# Patient Record
Sex: Female | Born: 1975 | Race: Black or African American | Hispanic: No | Marital: Married | Smoking: Never smoker
Health system: Southern US, Community
[De-identification: ages and names within clinical notes are randomized; demographics above are authoritative.]

## PROBLEM LIST (undated history)

## (undated) ENCOUNTER — Inpatient Hospital Stay (HOSPITAL_COMMUNITY): Payer: Self-pay

## (undated) DIAGNOSIS — R51 Headache: Secondary | ICD-10-CM

## (undated) HISTORY — PX: NO PAST SURGERIES: SHX2092

---

## 2004-02-15 ENCOUNTER — Emergency Department (HOSPITAL_COMMUNITY): Admission: EM | Admit: 2004-02-15 | Discharge: 2004-02-15 | Payer: Self-pay | Admitting: Emergency Medicine

## 2010-02-16 NOTE — L&D Delivery Note (Signed)
Delivery Note At 9:12 PM a viable female was delivered via Vaginal, Spontaneous Delivery (Presentation: Left Occiput Anterior).  APGAR: 9, 10; weight .   Placenta status: Intact, Spontaneous.  Cord: 3 vessels with the following complications: None.  Cord pH: not done  Anesthesia: Epidural  Episiotomy:  Lacerations: Sulcus;2nd degree;Perineal Suture Repair: 2.0 Est. Blood Loss (mL):   Mom to postpartum.  Baby to nursery-stable.  Lacey Wallman A 11/20/2010, 9:45 PM

## 2010-04-14 LAB — RPR: RPR: NONREACTIVE

## 2010-04-14 LAB — GC/CHLAMYDIA PROBE AMP, GENITAL
Chlamydia: NEGATIVE
Gonorrhea: NEGATIVE

## 2010-04-14 LAB — HEPATITIS B SURFACE ANTIGEN: Hepatitis B Surface Ag: NEGATIVE

## 2010-04-14 LAB — ABO/RH: RH Type: POSITIVE

## 2010-04-14 LAB — ANTIBODY SCREEN: Antibody Screen: NEGATIVE

## 2010-04-14 LAB — HIV ANTIBODY (ROUTINE TESTING W REFLEX): HIV: NONREACTIVE

## 2010-06-16 ENCOUNTER — Other Ambulatory Visit (HOSPITAL_COMMUNITY): Payer: Self-pay | Admitting: Obstetrics

## 2010-06-16 DIAGNOSIS — O2690 Pregnancy related conditions, unspecified, unspecified trimester: Secondary | ICD-10-CM

## 2010-06-20 ENCOUNTER — Other Ambulatory Visit (HOSPITAL_COMMUNITY): Payer: Self-pay | Admitting: Obstetrics

## 2010-06-20 ENCOUNTER — Ambulatory Visit (HOSPITAL_COMMUNITY)
Admission: RE | Admit: 2010-06-20 | Discharge: 2010-06-20 | Disposition: A | Discharge status: Home / Self Care | Payer: Medicaid Other | Source: Ambulatory Visit | Attending: Obstetrics | Admitting: Obstetrics

## 2010-06-20 DIAGNOSIS — Z0489 Encounter for examination and observation for other specified reasons: Secondary | ICD-10-CM

## 2010-06-20 DIAGNOSIS — O358XX0 Maternal care for other (suspected) fetal abnormality and damage, not applicable or unspecified: Secondary | ICD-10-CM | POA: Insufficient documentation

## 2010-06-20 DIAGNOSIS — O2690 Pregnancy related conditions, unspecified, unspecified trimester: Secondary | ICD-10-CM

## 2010-06-20 DIAGNOSIS — Z1389 Encounter for screening for other disorder: Secondary | ICD-10-CM | POA: Insufficient documentation

## 2010-06-20 DIAGNOSIS — Z363 Encounter for antenatal screening for malformations: Secondary | ICD-10-CM | POA: Insufficient documentation

## 2010-07-18 ENCOUNTER — Ambulatory Visit (HOSPITAL_COMMUNITY)
Admission: RE | Admit: 2010-07-18 | Discharge: 2010-07-18 | Disposition: A | Discharge status: Home / Self Care | Payer: Medicaid Other | Source: Ambulatory Visit | Attending: Obstetrics | Admitting: Obstetrics

## 2010-07-18 DIAGNOSIS — Z0489 Encounter for examination and observation for other specified reasons: Secondary | ICD-10-CM

## 2010-07-18 DIAGNOSIS — O358XX0 Maternal care for other (suspected) fetal abnormality and damage, not applicable or unspecified: Secondary | ICD-10-CM | POA: Insufficient documentation

## 2010-07-18 DIAGNOSIS — Z3689 Encounter for other specified antenatal screening: Secondary | ICD-10-CM | POA: Insufficient documentation

## 2010-08-19 ENCOUNTER — Inpatient Hospital Stay (HOSPITAL_COMMUNITY)
Admission: AD | Admit: 2010-08-19 | Discharge: 2010-08-19 | Disposition: A | Discharge status: Home / Self Care | Payer: Medicaid Other | Source: Ambulatory Visit | Attending: Obstetrics | Admitting: Obstetrics

## 2010-08-19 DIAGNOSIS — O99891 Other specified diseases and conditions complicating pregnancy: Secondary | ICD-10-CM | POA: Insufficient documentation

## 2010-08-19 DIAGNOSIS — O9989 Other specified diseases and conditions complicating pregnancy, childbirth and the puerperium: Secondary | ICD-10-CM

## 2010-10-16 LAB — STREP B DNA PROBE: GBS: NEGATIVE

## 2010-11-14 ENCOUNTER — Telehealth (HOSPITAL_COMMUNITY): Payer: Self-pay | Admitting: *Deleted

## 2010-11-14 ENCOUNTER — Encounter (HOSPITAL_COMMUNITY): Payer: Self-pay | Admitting: *Deleted

## 2010-11-14 NOTE — Telephone Encounter (Signed)
Preadmission screen  

## 2010-11-19 ENCOUNTER — Inpatient Hospital Stay (HOSPITAL_COMMUNITY): Payer: Medicaid Other | Admitting: Anesthesiology

## 2010-11-19 ENCOUNTER — Encounter (HOSPITAL_COMMUNITY): Payer: Self-pay

## 2010-11-19 ENCOUNTER — Encounter (HOSPITAL_COMMUNITY): Payer: Self-pay | Admitting: Anesthesiology

## 2010-11-19 ENCOUNTER — Inpatient Hospital Stay (HOSPITAL_COMMUNITY)
Admission: RE | Admit: 2010-11-19 | Discharge: 2010-11-22 | DRG: 775 | Disposition: A | Discharge status: Home / Self Care | Payer: Medicaid Other | Source: Ambulatory Visit | Attending: Obstetrics | Admitting: Obstetrics

## 2010-11-19 LAB — RPR: RPR Ser Ql: NONREACTIVE

## 2010-11-19 LAB — CBC
MCH: 31.7 pg (ref 26.0–34.0)
Platelets: 156 10*3/uL (ref 150–400)
RBC: 4.01 MIL/uL (ref 3.87–5.11)
WBC: 9 10*3/uL (ref 4.0–10.5)

## 2010-11-19 MED ORDER — FLEET ENEMA 7-19 GM/118ML RE ENEM: 1.0000 | ENEMA | RECTAL | Status: DC | PRN

## 2010-11-19 MED ORDER — PHENYLEPHRINE 40 MCG/ML (10ML) SYRINGE FOR IV PUSH (FOR BLOOD PRESSURE SUPPORT)
80.0000 ug | PREFILLED_SYRINGE | INTRAVENOUS | Status: DC | PRN
  Filled 2010-11-19 (×2): qty 5

## 2010-11-19 MED ORDER — ACETAMINOPHEN 325 MG PO TABS
650.0000 mg | ORAL_TABLET | ORAL | Status: DC | PRN
  Administered 2010-11-20 (×2): 650 mg via ORAL
  Filled 2010-11-19 (×2): qty 2

## 2010-11-19 MED ORDER — PROMETHAZINE HCL 25 MG/ML IJ SOLN
12.5000 mg | Freq: Four times a day (QID) | INTRAMUSCULAR | Status: DC | PRN
  Administered 2010-11-19: 12.5 mg via INTRAVENOUS
  Filled 2010-11-19 (×2): qty 1

## 2010-11-19 MED ORDER — LACTATED RINGERS IV SOLN: 500.0000 mL | Freq: Once | INTRAVENOUS | Status: DC

## 2010-11-19 MED ORDER — OXYTOCIN 20 UNITS IN LACTATED RINGERS INFUSION - SIMPLE
INTRAVENOUS | Status: AC
  Filled 2010-11-19: qty 1000

## 2010-11-19 MED ORDER — LIDOCAINE HCL (PF) 1 % IJ SOLN
30.0000 mL | INTRAMUSCULAR | Status: DC | PRN
  Administered 2010-11-20: 30 mL via SUBCUTANEOUS
  Filled 2010-11-19: qty 30

## 2010-11-19 MED ORDER — IBUPROFEN 600 MG PO TABS
600.0000 mg | ORAL_TABLET | Freq: Four times a day (QID) | ORAL | Status: DC | PRN
  Administered 2010-11-20: 600 mg via ORAL
  Filled 2010-11-19: qty 1

## 2010-11-19 MED ORDER — FENTANYL 2.5 MCG/ML BUPIVACAINE 1/10 % EPIDURAL INFUSION (WH - ANES)
14.0000 mL/h | INTRAMUSCULAR | Status: DC
  Administered 2010-11-19 – 2010-11-20 (×5): 14 mL/h via EPIDURAL
  Filled 2010-11-19 (×8): qty 60

## 2010-11-19 MED ORDER — OXYTOCIN 20 UNITS IN LACTATED RINGERS INFUSION - SIMPLE
1.0000 m[IU]/min | INTRAVENOUS | Status: DC
  Administered 2010-11-19: 2 m[IU]/min via INTRAVENOUS

## 2010-11-19 MED ORDER — OXYCODONE-ACETAMINOPHEN 5-325 MG PO TABS
2.0000 | ORAL_TABLET | ORAL | Status: DC | PRN
  Administered 2010-11-20 – 2010-11-21 (×2): 2 via ORAL
  Administered 2010-11-21 – 2010-11-22 (×2): 1 via ORAL
  Filled 2010-11-19 (×2): qty 2

## 2010-11-19 MED ORDER — TERBUTALINE SULFATE 1 MG/ML IJ SOLN: 0.2500 mg | Freq: Once | INTRAMUSCULAR | Status: AC | PRN

## 2010-11-19 MED ORDER — LACTATED RINGERS IV SOLN
INTRAVENOUS | Status: DC
  Administered 2010-11-19 – 2010-11-20 (×6): via INTRAVENOUS
  Administered 2010-11-20 (×3): 500 mL via INTRAVENOUS

## 2010-11-19 MED ORDER — DIPHENHYDRAMINE HCL 50 MG/ML IJ SOLN: 12.5000 mg | INTRAMUSCULAR | Status: DC | PRN

## 2010-11-19 MED ORDER — PHENYLEPHRINE 40 MCG/ML (10ML) SYRINGE FOR IV PUSH (FOR BLOOD PRESSURE SUPPORT)
80.0000 ug | PREFILLED_SYRINGE | INTRAVENOUS | Status: DC | PRN
  Filled 2010-11-19: qty 5

## 2010-11-19 MED ORDER — BUTORPHANOL TARTRATE 2 MG/ML IJ SOLN
1.0000 mg | INTRAMUSCULAR | Status: DC | PRN
  Administered 2010-11-19 (×2): 1 mg via INTRAVENOUS
  Filled 2010-11-19 (×2): qty 1

## 2010-11-19 MED ORDER — EPHEDRINE 5 MG/ML INJ
10.0000 mg | INTRAVENOUS | Status: DC | PRN
  Filled 2010-11-19: qty 4

## 2010-11-19 MED ORDER — LACTATED RINGERS IV SOLN
500.0000 mL | INTRAVENOUS | Status: DC | PRN
  Administered 2010-11-19: 300 mL via INTRAVENOUS

## 2010-11-19 MED ORDER — ONDANSETRON HCL 4 MG/2ML IJ SOLN: 4.0000 mg | Freq: Four times a day (QID) | INTRAMUSCULAR | Status: DC | PRN

## 2010-11-19 MED ORDER — CITRIC ACID-SODIUM CITRATE 334-500 MG/5ML PO SOLN: 30.0000 mL | ORAL | Status: DC | PRN

## 2010-11-19 MED ORDER — FENTANYL 2.5 MCG/ML BUPIVACAINE 1/10 % EPIDURAL INFUSION (WH - ANES)
INTRAMUSCULAR | Status: DC | PRN
  Administered 2010-11-19: 14 mL/h via EPIDURAL
  Administered 2010-11-20

## 2010-11-19 MED ORDER — LIDOCAINE HCL 1.5 % IJ SOLN
INTRAMUSCULAR | Status: DC | PRN
  Administered 2010-11-19 (×2): 4 mL via INTRADERMAL

## 2010-11-19 MED ORDER — OXYTOCIN BOLUS FROM INFUSION
500.0000 mL | Freq: Once | INTRAVENOUS | Status: DC
  Filled 2010-11-19: qty 500
  Filled 2010-11-19: qty 1000

## 2010-11-19 MED ORDER — EPHEDRINE 5 MG/ML INJ
10.0000 mg | INTRAVENOUS | Status: DC | PRN
  Filled 2010-11-19 (×2): qty 4

## 2010-11-19 MED ORDER — OXYTOCIN 20 UNITS IN LACTATED RINGERS INFUSION - SIMPLE
125.0000 mL/h | Freq: Once | INTRAVENOUS | Status: AC
  Administered 2010-11-20: 1 m[IU]/min via INTRAVENOUS

## 2010-11-19 NOTE — Progress Notes (Signed)
Dr Tamela Oddi notified of fhr tracing and decels, also notified of sve and that pt had been 4cm for several hrs, md also made aware of pitocin rate and uc pattern, md says to turn pitocin off and to call her back if variables do not resolve

## 2010-11-19 NOTE — Plan of Care (Signed)
Problem: Consults Goal: Birthing Suites Patient Information Press F2 to bring up selections list   Pt > [redacted] weeks EGA     

## 2010-11-19 NOTE — Anesthesia Procedure Notes (Signed)
Epidural Patient location during procedure: OB Start time: 11/19/2010 5:45 PM  Staffing Anesthesiologist: Karen Kinnard A. Performed by: anesthesiologist   Preanesthetic Checklist Completed: patient identified, site marked, surgical consent, pre-op evaluation, timeout performed, IV checked, risks and benefits discussed and monitors and equipment checked  Epidural Patient position: sitting Prep: site prepped and draped and DuraPrep Patient monitoring: continuous pulse ox and blood pressure Approach: midline Injection technique: LOR air  Needle:  Needle type: Tuohy  Needle gauge: 17 G Needle length: 9 cm Needle insertion depth: 5 cm cm Catheter type: closed end flexible Catheter size: 19 Gauge Catheter at skin depth: 10 cm Test dose: negative and 1.5% lidocaine  Assessment Events: blood not aspirated, injection not painful, no injection resistance, negative IV test and no paresthesia  Additional Notes Patient is more comfortable after epidural dosed. Please see RN's note for documentation of vital signs and FHR which are stable.

## 2010-11-19 NOTE — H&P (Signed)
This is Dr. Francoise Ceo dictating the history and physical on blank blank She's a 35 year old gravida 1 EDC 11/16/2010 she is 14 weeks and 3 days her GBS is negative Blood type O positive She's in for induction of labor her cervix is 2 cm 80% vertex -2 station She is having occasional contractions Past medical history negative Past surgical history negative Family history negative Physical exam this is a well-developed female in no distress HEENT negative Lungs clear Abdomen term Pelvic as described above Extremities negative and Heart regular rhythm no murmurs no gallops

## 2010-11-19 NOTE — Anesthesia Preprocedure Evaluation (Signed)
Anesthesia Evaluation  Name, MR# and DOB Patient awake  General Assessment Comment  Reviewed: Allergy & Precautions, H&P , Patient's Chart, lab work & pertinent test results  Airway Mallampati: III TM Distance: >3 FB Neck ROM: full    Dental No notable dental hx. (+) Teeth Intact   Pulmonary  clear to auscultation  Pulmonary exam normal       Cardiovascular regular Normal    Neuro/Psych Negative Neurological ROS  Negative Psych ROS   GI/Hepatic negative GI ROS Neg liver ROS    Endo/Other  Negative Endocrine ROS  Renal/GU negative Renal ROS  Genitourinary negative   Musculoskeletal   Abdominal   Peds  Hematology negative hematology ROS (+)   Anesthesia Other Findings   Reproductive/Obstetrics (+) Pregnancy                           Anesthesia Physical Anesthesia Plan  ASA: II  Anesthesia Plan: Epidural   Post-op Pain Management:    Induction:   Airway Management Planned:   Additional Equipment:   Intra-op Plan:   Post-operative Plan:   Informed Consent: I have reviewed the patients History and Physical, chart, labs and discussed the procedure including the risks, benefits and alternatives for the proposed anesthesia with the patient or authorized representative who has indicated his/her understanding and acceptance.     Plan Discussed with: Anesthesiologist  Anesthesia Plan Comments:         Anesthesia Quick Evaluation  

## 2010-11-19 NOTE — Progress Notes (Signed)
  Patient's cervix was 1-2 cm the vertex at -2 station She is a good labor pattern and will get her epidural shortly and can't in in labor labor tonight

## 2010-11-20 ENCOUNTER — Encounter (HOSPITAL_COMMUNITY): Payer: Self-pay

## 2010-11-20 MED ORDER — OXYTOCIN 10 UNIT/ML IJ SOLN
INTRAMUSCULAR | Status: AC
  Filled 2010-11-20: qty 2

## 2010-11-20 MED ORDER — SODIUM CHLORIDE 0.9 % IV SOLN
2.0000 g | Freq: Four times a day (QID) | INTRAVENOUS | Status: DC
  Administered 2010-11-20 (×4): 2 g via INTRAVENOUS
  Filled 2010-11-20 (×5): qty 2000

## 2010-11-20 MED ORDER — GENTAMICIN SULFATE 40 MG/ML IJ SOLN
200.0000 mg | Freq: Once | INTRAVENOUS | Status: AC
  Administered 2010-11-20: 200 mg via INTRAVENOUS
  Filled 2010-11-20: qty 5

## 2010-11-20 MED ORDER — OXYTOCIN 20 UNITS IN LACTATED RINGERS INFUSION - SIMPLE
1.0000 m[IU]/min | INTRAVENOUS | Status: DC
  Administered 2010-11-20: 8 m[IU]/min via INTRAVENOUS

## 2010-11-20 MED ORDER — LACTATED RINGERS IV SOLN
INTRAVENOUS | Status: DC
  Administered 2010-11-20: 14:00:00 via INTRAUTERINE
  Administered 2010-11-20: 325 mL via INTRAUTERINE

## 2010-11-20 MED ORDER — GENTAMICIN SULFATE 40 MG/ML IJ SOLN
170.0000 mg | Freq: Three times a day (TID) | INTRAVENOUS | Status: DC
  Administered 2010-11-20 (×2): 170 mg via INTRAVENOUS
  Filled 2010-11-20 (×3): qty 4.25

## 2010-11-20 MED ORDER — OXYTOCIN 20 UNITS IN LACTATED RINGERS INFUSION - SIMPLE
1.0000 m[IU]/min | INTRAVENOUS | Status: DC
  Administered 2010-11-20: 9 m[IU]/min via INTRAVENOUS
  Administered 2010-11-20: 5 m[IU]/min via INTRAVENOUS
  Administered 2010-11-20: 7 m[IU]/min via INTRAVENOUS
  Filled 2010-11-20: qty 1000

## 2010-11-20 NOTE — Progress Notes (Signed)
Dr Tamela Oddi called to say that she has reviewed fhr tracing again and is all right with it

## 2010-11-20 NOTE — Progress Notes (Signed)
  Patient has no 5-6 cm and a thickened tubular and the rest of the cervix 9% effaced She is still at -2 station and is having some occasional bearable decelerations An IUPC was inserted and amnioinfusion was started loading dose 325 Ringer's lactate 225 cc per hour

## 2010-11-20 NOTE — Consults (Signed)
ANTIBIOTIC CONSULT NOTE - INITIAL  Pharmacy Consult for gentamicin Indication: maternal fever  No Known Allergies  Patient Measurements: Height: 5\' 6"  (167.6 cm) Weight: 200 lb (90.719 kg) IBW/kg (Calculated) : 59.3  Adjusted Body Weight: 68.8 kg  Vital Signs: Temp: 100.5 F (38.1 C) (10/04 0102) Temp src: Oral (10/04 0102) BP: 134/81 mmHg (10/04 0102) Pulse Rate: 91  (10/04 0102) Intake/Output from previous day:   Intake/Output from this shift:    Labs:  Basename 11/19/10 0805  WBC 9.0  HGB 12.7  PLT 156  LABCREA --  CREATININE --   CrCl is unknown because no creatinine reading has been taken.    Microbiology: No results found for this or any previous visit (from the past 720 hour(s)).  Medical History: Past Medical History  Diagnosis Date  . No pertinent past medical history     Medications:     Ampicillin 2gm IV q6h Assessment:    Coverage for infection of presumed pelvic origin; R/O chorioamniotitis.  Normal urine output - therefore assumed CrCl 11ml/min for age and pregnancy.  Goal of Therapy:    Desire gentamicin peak level 6-36mcg/ml & trough level <1mcg/ml  Plan:  (1) Loading dose = 200mg  gentamicin x 1, then (2) Maintenance regimen 170mg  gentamicin q8h (if continued) (3) Will measure serum creatinine Friday morning to confirm est CrCl if tx continued. (4) Will measure actual serum gentamicin peak and trough levels if tx continued >48-72 hr or as clinically indicated.   Arby Dahir, Lloyd Huger E 11/20/2010,1:23 AM

## 2010-11-20 NOTE — Progress Notes (Signed)
Dr Tamela Oddi notified of temp, gives order for ampicillin and gentamycin, also says to restart pitocin and not to go about 33mu/min

## 2010-11-20 NOTE — Progress Notes (Signed)
Dr Tamela Oddi in hospital, called and requested to come and look at fhr tracing, informed of sve and pitocin rate, md says that she has just reviewed the tracing and thought it looked "fine" but will review fhr again

## 2010-11-21 ENCOUNTER — Encounter (HOSPITAL_COMMUNITY): Payer: Self-pay

## 2010-11-21 LAB — CBC
MCH: 32.2 pg (ref 26.0–34.0)
MCHC: 34.9 g/dL (ref 30.0–36.0)
Platelets: 118 10*3/uL — ABNORMAL LOW (ref 150–400)

## 2010-11-21 MED ORDER — FERROUS SULFATE 325 (65 FE) MG PO TABS
325.0000 mg | ORAL_TABLET | Freq: Two times a day (BID) | ORAL | Status: DC
  Administered 2010-11-21 – 2010-11-22 (×3): 325 mg via ORAL
  Filled 2010-11-21 (×3): qty 1

## 2010-11-21 MED ORDER — DIPHENHYDRAMINE HCL 25 MG PO CAPS: 25.0000 mg | ORAL_CAPSULE | Freq: Four times a day (QID) | ORAL | Status: DC | PRN

## 2010-11-21 MED ORDER — ONDANSETRON HCL 4 MG/2ML IJ SOLN: 4.0000 mg | INTRAMUSCULAR | Status: DC | PRN

## 2010-11-21 MED ORDER — IBUPROFEN 600 MG PO TABS
600.0000 mg | ORAL_TABLET | Freq: Four times a day (QID) | ORAL | Status: DC
  Administered 2010-11-21 – 2010-11-22 (×6): 600 mg via ORAL
  Filled 2010-11-21 (×6): qty 1

## 2010-11-21 MED ORDER — TETANUS-DIPHTH-ACELL PERTUSSIS 5-2.5-18.5 LF-MCG/0.5 IM SUSP: 0.5000 mL | Freq: Once | INTRAMUSCULAR | Status: DC

## 2010-11-21 MED ORDER — BENZOCAINE-MENTHOL 20-0.5 % EX AERO: 1.0000 "application " | INHALATION_SPRAY | CUTANEOUS | Status: DC | PRN

## 2010-11-21 MED ORDER — SIMETHICONE 80 MG PO CHEW: 80.0000 mg | CHEWABLE_TABLET | ORAL | Status: DC | PRN

## 2010-11-21 MED ORDER — BENZOCAINE-MENTHOL 20-0.5 % EX AERO
INHALATION_SPRAY | CUTANEOUS | Status: AC
  Filled 2010-11-21: qty 56

## 2010-11-21 MED ORDER — ONDANSETRON HCL 4 MG PO TABS: 4.0000 mg | ORAL_TABLET | ORAL | Status: DC | PRN

## 2010-11-21 MED ORDER — DIBUCAINE 1 % RE OINT: 1.0000 "application " | TOPICAL_OINTMENT | RECTAL | Status: DC | PRN

## 2010-11-21 MED ORDER — LANOLIN HYDROUS EX OINT: TOPICAL_OINTMENT | CUTANEOUS | Status: DC | PRN

## 2010-11-21 MED ORDER — PRENATAL PLUS 27-1 MG PO TABS
1.0000 | ORAL_TABLET | Freq: Every day | ORAL | Status: DC
  Administered 2010-11-21 – 2010-11-22 (×2): 1 via ORAL
  Filled 2010-11-21 (×2): qty 1

## 2010-11-21 MED ORDER — ZOLPIDEM TARTRATE 5 MG PO TABS: 5.0000 mg | ORAL_TABLET | Freq: Every evening | ORAL | Status: DC | PRN

## 2010-11-21 MED ORDER — WITCH HAZEL-GLYCERIN EX PADS: 1.0000 "application " | MEDICATED_PAD | CUTANEOUS | Status: DC | PRN

## 2010-11-21 MED ORDER — OXYCODONE-ACETAMINOPHEN 5-325 MG PO TABS
1.0000 | ORAL_TABLET | ORAL | Status: DC | PRN
  Administered 2010-11-21 (×2): 1 via ORAL
  Filled 2010-11-21 (×4): qty 1

## 2010-11-21 MED ORDER — BENZOCAINE-MENTHOL 20-0.5 % EX AERO
INHALATION_SPRAY | CUTANEOUS | Status: AC
  Administered 2010-11-21: 06:00:00
  Filled 2010-11-21: qty 56

## 2010-11-21 MED ORDER — SENNOSIDES-DOCUSATE SODIUM 8.6-50 MG PO TABS
2.0000 | ORAL_TABLET | Freq: Every day | ORAL | Status: DC
  Administered 2010-11-21: 2 via ORAL

## 2010-11-21 NOTE — Anesthesia Postprocedure Evaluation (Signed)
  Anesthesia Post-op Note  Patient: Virginia [name]  Procedure(s) Performed: * Lumbar Epidural for L&D *  Patient Location: Mother/Baby  Anesthesia Type: Epidural  Level of Consciousness: awake, alert  and oriented  Airway and Oxygen Therapy: Patient Spontanous Breathing  Post-op Pain: none  Post-op Assessment: Post-op Vital signs reviewed, Patient's Cardiovascular Status Stable, Respiratory Function Stable, Patent Airway, No signs of Nausea or vomiting, Adequate PO intake, Pain level controlled, No headache, No backache, No residual numbness and No residual motor weakness  Post-op Vital Signs: Reviewed and stable  Complications: No apparent anesthesia complications

## 2010-11-21 NOTE — Progress Notes (Signed)
  Postpartum day one Vital signs normal Lochia moderate Legs negative No complaints

## 2010-11-21 NOTE — Progress Notes (Signed)
UR Chart review completed.  

## 2010-11-22 MED ORDER — OXYCODONE-ACETAMINOPHEN 5-325 MG PO TABS: 1.0000 | ORAL_TABLET | ORAL | Status: AC | PRN

## 2010-11-22 MED ORDER — BENZOCAINE-MENTHOL 20-0.5 % EX AERO
INHALATION_SPRAY | CUTANEOUS | Status: AC
  Administered 2010-11-22: 10:00:00
  Filled 2010-11-22: qty 56

## 2010-11-22 MED ORDER — IBUPROFEN 600 MG PO TABS: 600.0000 mg | ORAL_TABLET | Freq: Four times a day (QID) | ORAL | Status: AC

## 2010-11-22 NOTE — Progress Notes (Signed)
Post Partum Day #2 S/P:spontaneous vaginal  RH status/Rubella reviewed.  Feeding: breast Subjective: No HA, SOB, CP, F/C, breast symptoms: No. Normal vaginal bleeding, no clots.     Objective:  Blood pressure 121/72, pulse 69, temperature 98.2 F (36.8 C), temperature source Oral, resp. rate 18, height 5\' 6"  (1.676 m), weight 90.719 kg (200 lb), last menstrual period 02/09/2010, unknown if currently breastfeeding.   Physical Exam:  General: alert Lochia: appropriate Uterine Fundus: firm DVT Evaluation: No evidence of DVT seen on physical exam. Ext: No c/c/e  Basename 11/21/10 0525 11/19/10 0805  HGB 9.6* 12.7  HCT 27.5* 37.4    Assessment/Plan: 35 y.o.  PPD # 2 .  normal postpartum exam Continue current postpartum care D/C home   LOS: 3 days   JACKSON-MOORE,Dawaun Brancato A 11/22/2010, 6:21 AM

## 2010-11-22 NOTE — Discharge Summary (Signed)
  Obstetric Discharge Summary Reason for Admission:IOL Prenatal Procedures: none Intrapartum Procedures: spontaneous vaginal delivery Postpartum Procedures: none Complications-Operative and Postpartum: none  Hemoglobin  Date Value Range Status  11/21/2010 9.6* 12.0-15.0 (g/dL) Final     DELTA CHECK NOTED     REPEATED TO VERIFY     HCT  Date Value Range Status  11/21/2010 27.5* 36.0-46.0 (%) Final    Discharge Diagnoses: Term Pregnancy-delivered  Discharge Information: Date: 11/22/2010 Activity: pelvic rest Diet: routine Medications: Ibuprofen, Percocet, PNV Condition: stable Instructions: refer to routine discharge instructions Discharge to: home Follow-up Information    Follow up with MARSHALL,BERNARD A, MD. Call in 6 weeks.   Contact information:   [address] [place] [address] [phone]          Newborn Data: Live born  Information for the patient's newborn:  Kelby Aline, Boy Dzentutu [[id number]]  female ; APGAR , ; weight ;  Home with mother.  JACKSON-MOORE,Tameah Mihalko A 11/22/2010, 6:19 AM

## 2011-01-01 ENCOUNTER — Encounter (HOSPITAL_COMMUNITY): Payer: Self-pay

## 2011-01-01 ENCOUNTER — Inpatient Hospital Stay (HOSPITAL_COMMUNITY)
Admission: AD | Admit: 2011-01-01 | Discharge: 2011-01-01 | Disposition: A | Discharge status: Home / Self Care | Payer: Medicaid Other | Source: Ambulatory Visit | Attending: Obstetrics | Admitting: Obstetrics

## 2011-01-01 DIAGNOSIS — R509 Fever, unspecified: Secondary | ICD-10-CM

## 2011-01-01 DIAGNOSIS — O864 Pyrexia of unknown origin following delivery: Secondary | ICD-10-CM | POA: Insufficient documentation

## 2011-01-01 LAB — URINALYSIS, ROUTINE W REFLEX MICROSCOPIC
Hgb urine dipstick: NEGATIVE
Protein, ur: NEGATIVE mg/dL
Urobilinogen, UA: 0.2 mg/dL (ref 0.0–1.0)

## 2011-01-01 LAB — DIFFERENTIAL
Basophils Absolute: 0 10*3/uL (ref 0.0–0.1)
Eosinophils Relative: 2 % (ref 0–5)
Lymphocytes Relative: 48 % — ABNORMAL HIGH (ref 12–46)

## 2011-01-01 LAB — CBC
MCV: 95.1 fL (ref 78.0–100.0)
Platelets: 216 10*3/uL (ref 150–400)
RDW: 14.1 % (ref 11.5–15.5)
WBC: 6.4 10*3/uL (ref 4.0–10.5)

## 2011-01-01 LAB — URINE MICROSCOPIC-ADD ON

## 2011-01-01 MED ORDER — ACETAMINOPHEN 325 MG PO TABS
650.0000 mg | ORAL_TABLET | Freq: Once | ORAL | Status: AC
  Administered 2011-01-01: 650 mg via ORAL
  Filled 2011-01-01: qty 2

## 2011-01-01 NOTE — Progress Notes (Signed)
Vaginal delivery w/episiotomy 11/20/2010

## 2011-01-01 NOTE — Progress Notes (Signed)
Pt states intermittent back pain, neck pain began 3 days ago, rates 9/10. Has lower abd pain on r side only, radiates down right leg. Mouth feels sour, has n/v, no diarrhea. Feels feverish. Denies sore throat.
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
## 2011-01-01 NOTE — ED Provider Notes (Signed)
History     Chief Complaint  Patient presents with  . Back Pain  . Neck Pain   HPI  Pt is 6 weeks PP after indused vaginal delivery with epdirual.  Three days ago she was running a fever of 102 with nausea and vomiting with chills and back/neck pain. She said she was too weak to come in 3 days ago and yesterday she did not have anyone to keep the baby. Today her temp is 98.66F. She used Eucolyptus  In the bath. She took one Tylenol#3 this morning at 9 am which helped. She is breast feeding.  She has not noticed any pain with her breasts or red streaks.  She denies cough or pain with urination.  Pt denies abdominal pain, she is having minimal pink vaginal discharge/bleeding.    Past Medical History  Diagnosis Date  . No pertinent past medical history     Past Surgical History  Procedure Date  . No past surgeries     No family history on file.  History  Substance Use Topics  . Smoking status: Never Smoker   . Smokeless tobacco: Not on file  . Alcohol Use: No    Allergies: No Known Allergies  Prescriptions prior to admission  Medication Sig Dispense Refill  . castor oil liquid Take 30 mLs by mouth once a week.        . folic acid (FOLVITE) 800 MCG tablet Take 800 mcg by mouth daily.        . prenatal vitamin w/FE, FA (PRENATAL 1 + 1) 27-1 MG TABS Take 1 tablet by mouth daily.          Review of Systems  Constitutional: Positive for fever and chills.  HENT: Positive for neck pain. Negative for congestion.   Respiratory: Negative for cough.   Gastrointestinal: Positive for nausea and vomiting. Negative for abdominal pain, diarrhea and constipation.  Genitourinary: Negative for dysuria.  Neurological: Positive for headaches.   Physical Exam   Blood pressure 113/75, pulse 62, temperature 98.5 F (36.9 C), temperature source Oral, resp. rate 16, height 5' 5.5" (1.664 m), weight 181 lb 6 oz (82.271 kg), currently breastfeeding.  Physical Exam  Constitutional: She is  oriented to person, place, and time. She appears well-developed and well-nourished.  Eyes: Pupils are equal, round, and reactive to light.  Neck: Normal range of motion.  Respiratory: Effort normal and breath sounds normal. No respiratory distress. She has no wheezes.       Breasts soft without palpable nodules- small area of reddness on right upper outer breast-   GI: Soft.  Musculoskeletal: Normal range of motion.  Neurological: She is alert and oriented to person, place, and time.  Skin: Skin is warm and dry.  Psychiatric: She has a normal mood and affect.    MAU Course  Procedures Exam- no abnormal findings CBC with diff- normal Urinalysis- normal except trace of hemoglobin Discussed with Dr. Tamela Oddi- pt afebrile at this time- will treat symptomatically   Assessment and Plan  PP fever of unknown origin Take Tylenol as needed Follow up for PP exam; call sooner if fever returns or increase in pain  Dorathy Stallone 01/01/2011, 11:35 AM

## 2013-02-06 LAB — OB RESULTS CONSOLE GC/CHLAMYDIA
Chlamydia: NEGATIVE
Gonorrhea: NEGATIVE

## 2013-02-06 LAB — OB RESULTS CONSOLE HIV ANTIBODY (ROUTINE TESTING): HIV: NONREACTIVE

## 2013-02-06 LAB — OB RESULTS CONSOLE RUBELLA ANTIBODY, IGM: RUBELLA: IMMUNE

## 2013-02-06 LAB — OB RESULTS CONSOLE RPR: RPR: NONREACTIVE

## 2013-02-16 NOTE — L&D Delivery Note (Signed)
Delivery Note At 7:27 AM a viable and healthy female was delivered via Vaginal, Spontaneous Delivery (Presentation: Left Occiput Anterior).  APGAR: 9, 9; weight .   Placenta status: Intact, Spontaneous.  Cord: 3 vessels with the following complications: None.    Anesthesia: Epidural  Episiotomy: None Lacerations: 1st degree  Shallow but circled from center of introitus up side to left. Suture Repair: 3.0 monocryl Est. Blood Loss (mL): 200  Mom to postpartum.  Baby to Couplet care / Skin to Skin.  [facility] AscWILLIAMS,Shawn Carattini 09/10/2013, 7:44 AM

## 2013-02-16 NOTE — L&D Delivery Note (Signed)
Attestation of Attending Supervision of Advanced Practitioner (PA/CNM/NP): Evaluation and management procedures were performed by the Advanced Practitioner under my supervision and collaboration.  I have reviewed the Advanced Practitioner's note and chart, and I agree with the management and plan.  Dillyn Joaquin, MD, FACOG Attending Obstetrician & Gynecologist Faculty Practice, [facility]   

## 2013-05-13 ENCOUNTER — Ambulatory Visit: Payer: Self-pay | Admitting: Physician Assistant

## 2013-05-13 VITALS — BP 124/72 | HR 83 | Temp 98.1°F | Resp 16 | Ht 67.0 in | Wt 175.0 lb

## 2013-05-13 DIAGNOSIS — H9209 Otalgia, unspecified ear: Secondary | ICD-10-CM

## 2013-05-13 DIAGNOSIS — H6690 Otitis media, unspecified, unspecified ear: Secondary | ICD-10-CM

## 2013-05-13 MED ORDER — AMOXICILLIN 875 MG PO TABS: 875.0000 mg | ORAL_TABLET | Freq: Two times a day (BID) | ORAL | Status: DC

## 2013-05-13 MED ORDER — HYDROCODONE-ACETAMINOPHEN 5-325 MG PO TABS: 1.0000 | ORAL_TABLET | Freq: Four times a day (QID) | ORAL | Status: DC | PRN

## 2013-05-13 NOTE — Patient Instructions (Signed)
Otitis Media, Adult Otitis media is redness, soreness, and swelling (inflammation) of the middle ear. Otitis media may be caused by allergies or, most commonly, by infection. Often it occurs as a complication of the common cold. SIGNS AND SYMPTOMS Symptoms of otitis media may include:  Earache.  Fever.  Ringing in your ear.  Headache.  Leakage of fluid from the ear. DIAGNOSIS To diagnose otitis media, your health care provider will examine your ear with an otoscope. This is an instrument that allows your health care provider to see into your ear in order to examine your eardrum. Your health care provider also will ask you questions about your symptoms. TREATMENT  Typically, otitis media resolves on its own within 3 5 days. Your health care provider may prescribe medicine to ease your symptoms of pain. If otitis media does not resolve within 5 days or is recurrent, your health care provider may prescribe antibiotic medicines if he or she suspects that a bacterial infection is the cause. HOME CARE INSTRUCTIONS   Take your medicine as directed until it is gone, even if you feel better after the first few days.  Only take over-the-counter or prescription medicines for pain, discomfort, or fever as directed by your health care provider.  Follow up with your health care provider as directed. SEEK MEDICAL CARE IF:  You have otitis media only in one ear or bleeding from your nose or both.  You notice a lump on your neck.  You are not getting better in 3 5 days.  You feel worse instead of better. SEEK IMMEDIATE MEDICAL CARE IF:   You have pain that is not controlled with medicine.  You have swelling, redness, or pain around your ear or stiffness in your neck.  You notice that part of your face is paralyzed.  You notice that the bone behind your ear (mastoid) is tender when you touch it. MAKE SURE YOU:   Understand these instructions.  Will watch your condition.  Will get help  right away if you are not doing well or get worse. Document Released: 11/08/2003 Document Revised: 11/23/2012 Document Reviewed: 08/30/2012 Exit[employer] Patient Information 2014 [employer].  

## 2013-05-13 NOTE — Progress Notes (Signed)
   Subjective:    Patient ID: Virginia [name], female    DOB: [birth year], 38 y.o.   MRN: [id number]  HPI  38 year old female presents for evaluation of acute onset of right ear pain that started suddenly today.  States she was taking a bath and her 38 year old put water in her nose. When she tried to blow it out, she had pain in her right ear associated with fullness and pressure.  This occurred about 8 hours ago. She has continued to hold her nose and try to "blow the water out." admits this causes her a great deal of pain to do and does not seem to be helping. She has never had an ear infection before. Denies any nasal congestion, cough, sore throat, fever or chills. Has not noticed any drainage from her ear.   Patient is otherwise healthy with no other concerns today.  She is 5 months pregnant.     Review of Systems  Constitutional: Negative for fever and chills.  HENT: Positive for ear pain (right side). Negative for congestion, postnasal drip, rhinorrhea, sinus pressure and sore throat.   Gastrointestinal: Negative for nausea and vomiting.  Neurological: Positive for headaches (right side). Negative for dizziness.       Objective:   Physical Exam  Constitutional: She is oriented to person, place, and time. She appears well-developed and well-nourished.  HENT:  Head: Normocephalic and atraumatic.  Right Ear: Hearing, external ear and ear canal normal. Tympanic membrane is erythematous. A middle ear effusion is present.  Left Ear: Hearing, tympanic membrane, external ear and ear canal normal.  Mouth/Throat: Uvula is midline, oropharynx is clear and moist and mucous membranes are normal.  Eyes: Conjunctivae are normal.  Neck: Normal range of motion.  Cardiovascular: Normal rate.   Pulmonary/Chest: Effort normal.  Neurological: She is alert and oriented to person, place, and time.  Psychiatric: She has a normal mood and affect. Her behavior is normal. Judgment and thought content normal.            Assessment & Plan:  Otitis media - Plan: amoxicillin (AMOXIL) 875 MG tablet  Otalgia - Plan: HYDROcodone-acetaminophen (NORCO) 5-325 MG per tablet  Patient has an AOM with an erythematous TM and middle ear effusion. Unsure why symptoms started so suddenly today but will treat with amoxicillin 875 mg bid x 10 days. Norco 5/325 mg q6hours prn severe pain - use sparingly.  RTC precautions discussed. Recheck if symptoms worsening or if they fail to improve in 48-72 hours.

## 2013-06-23 LAB — OB RESULTS CONSOLE RPR: RPR: NONREACTIVE

## 2013-08-23 LAB — OB RESULTS CONSOLE GBS: STREP GROUP B AG: NEGATIVE

## 2013-09-09 ENCOUNTER — Encounter (HOSPITAL_COMMUNITY): Payer: Self-pay | Admitting: *Deleted

## 2013-09-09 ENCOUNTER — Inpatient Hospital Stay (HOSPITAL_COMMUNITY)
Admission: AD | Admit: 2013-09-09 | Discharge: 2013-09-12 | DRG: 775 | Disposition: A | Discharge status: Home / Self Care | Payer: Medicaid Other | Source: Ambulatory Visit | Attending: Obstetrics & Gynecology | Admitting: Obstetrics & Gynecology

## 2013-09-09 DIAGNOSIS — O09529 Supervision of elderly multigravida, unspecified trimester: Secondary | ICD-10-CM | POA: Diagnosis present

## 2013-09-09 DIAGNOSIS — O4290 Premature rupture of membranes, unspecified as to length of time between rupture and onset of labor, unspecified weeks of gestation: Secondary | ICD-10-CM | POA: Diagnosis present

## 2013-09-09 HISTORY — DX: Headache: R51

## 2013-09-09 LAB — CBC
HCT: 34.7 % — ABNORMAL LOW (ref 36.0–46.0)
Hemoglobin: 11.9 g/dL — ABNORMAL LOW (ref 12.0–15.0)
MCH: 32.4 pg (ref 26.0–34.0)
MCHC: 34.3 g/dL (ref 30.0–36.0)
MCV: 94.6 fL (ref 78.0–100.0)
Platelets: 186 K/uL (ref 150–400)
RBC: 3.67 MIL/uL — ABNORMAL LOW (ref 3.87–5.11)
RDW: 15.1 % (ref 11.5–15.5)
WBC: 10.9 K/uL — ABNORMAL HIGH (ref 4.0–10.5)

## 2013-09-09 MED ORDER — EPHEDRINE 5 MG/ML INJ
10.0000 mg | INTRAVENOUS | Status: DC | PRN
  Filled 2013-09-09: qty 2

## 2013-09-09 MED ORDER — CITRIC ACID-SODIUM CITRATE 334-500 MG/5ML PO SOLN: 30.0000 mL | ORAL | Status: DC | PRN

## 2013-09-09 MED ORDER — TERBUTALINE SULFATE 1 MG/ML IJ SOLN: 0.2500 mg | Freq: Once | INTRAMUSCULAR | Status: AC | PRN

## 2013-09-09 MED ORDER — PHENYLEPHRINE 40 MCG/ML (10ML) SYRINGE FOR IV PUSH (FOR BLOOD PRESSURE SUPPORT)
80.0000 ug | PREFILLED_SYRINGE | INTRAVENOUS | Status: DC | PRN
  Filled 2013-09-09: qty 2
  Filled 2013-09-09: qty 10

## 2013-09-09 MED ORDER — LACTATED RINGERS IV SOLN
INTRAVENOUS | Status: DC
  Administered 2013-09-09 – 2013-09-10 (×2): via INTRAVENOUS

## 2013-09-09 MED ORDER — LIDOCAINE HCL (PF) 1 % IJ SOLN
30.0000 mL | INTRAMUSCULAR | Status: DC | PRN
  Filled 2013-09-09: qty 30

## 2013-09-09 MED ORDER — ACETAMINOPHEN 325 MG PO TABS: 650.0000 mg | ORAL_TABLET | ORAL | Status: DC | PRN

## 2013-09-09 MED ORDER — OXYTOCIN 40 UNITS IN LACTATED RINGERS INFUSION - SIMPLE MED: 62.5000 mL/h | INTRAVENOUS | Status: DC

## 2013-09-09 MED ORDER — OXYTOCIN BOLUS FROM INFUSION: 500.0000 mL | INTRAVENOUS | Status: DC

## 2013-09-09 MED ORDER — DIPHENHYDRAMINE HCL 50 MG/ML IJ SOLN
12.5000 mg | INTRAMUSCULAR | Status: DC | PRN
  Administered 2013-09-10: 12.5 mg via INTRAVENOUS
  Filled 2013-09-09: qty 1

## 2013-09-09 MED ORDER — OXYTOCIN 40 UNITS IN LACTATED RINGERS INFUSION - SIMPLE MED: 1.0000 m[IU]/min | INTRAVENOUS | Status: DC

## 2013-09-09 MED ORDER — ONDANSETRON HCL 4 MG/2ML IJ SOLN: 4.0000 mg | Freq: Four times a day (QID) | INTRAMUSCULAR | Status: DC | PRN

## 2013-09-09 MED ORDER — LACTATED RINGERS IV SOLN: 500.0000 mL | INTRAVENOUS | Status: DC | PRN

## 2013-09-09 MED ORDER — LACTATED RINGERS IV SOLN
500.0000 mL | Freq: Once | INTRAVENOUS | Status: AC
  Administered 2013-09-10: 500 mL via INTRAVENOUS

## 2013-09-09 MED ORDER — PHENYLEPHRINE 40 MCG/ML (10ML) SYRINGE FOR IV PUSH (FOR BLOOD PRESSURE SUPPORT)
80.0000 ug | PREFILLED_SYRINGE | INTRAVENOUS | Status: DC | PRN
  Filled 2013-09-09: qty 2

## 2013-09-09 MED ORDER — NALBUPHINE HCL 10 MG/ML IJ SOLN
10.0000 mg | INTRAMUSCULAR | Status: DC | PRN
  Administered 2013-09-10: 10 mg via INTRAVENOUS
  Filled 2013-09-09: qty 1

## 2013-09-09 MED ORDER — OXYTOCIN 40 UNITS IN LACTATED RINGERS INFUSION - SIMPLE MED
1.0000 m[IU]/min | INTRAVENOUS | Status: DC
Start: 2013-09-09 — End: 2013-09-09
  Administered 2013-09-09: 1 m[IU]/min via INTRAVENOUS
  Filled 2013-09-09: qty 1000

## 2013-09-09 MED ORDER — FENTANYL 2.5 MCG/ML BUPIVACAINE 1/10 % EPIDURAL INFUSION (WH - ANES)
14.0000 mL/h | INTRAMUSCULAR | Status: DC | PRN
  Administered 2013-09-10: 14 mL/h via EPIDURAL
  Filled 2013-09-09: qty 125

## 2013-09-09 MED ORDER — IBUPROFEN 600 MG PO TABS: 600.0000 mg | ORAL_TABLET | Freq: Four times a day (QID) | ORAL | Status: DC | PRN

## 2013-09-09 MED ORDER — OXYCODONE-ACETAMINOPHEN 5-325 MG PO TABS: 1.0000 | ORAL_TABLET | ORAL | Status: DC | PRN

## 2013-09-09 NOTE — Progress Notes (Signed)
Pt has signed [facility] papers, and IV has been removed. Pt requesting Faculty Practice physicians and midwives that are on call this weekend and is googling their information.

## 2013-09-09 NOTE — Progress Notes (Signed)
Received call from Dr. Ozan informing me that this patient does not want to continue her intrapartum care with [facility] or their covering partners, and wishes to be transferred to our service (Faculty Practice).  I met with patient, and was accompanied by other members of my team: Virginia Smith, CNM and Kristy Acosta, MD (OB Fellow). Explained the nature of our service, and the nature of rotating team members, no one is guaranteed to do her delivery.  Explained that students and residents are also part of our team. Patient verbalized understanding of the nature of our team; feels grateful that we accepted her "at this late hour". Will obtain her prenatal records from [facility], and do her H&P and orders.   UGONNA  ANYANWU, MD, FACOG Attending Obstetrician & Gynecologist Faculty Practice, [facility] 

## 2013-09-09 NOTE — Progress Notes (Signed)
Virginia [name] is a 38 y.o. G2P1001 at 8572w1d by ultrasound admitted for rupture of membranes  Subjective: Not uncomfortable.  Objective: BP 116/62  Pulse 88  Temp(Src) 98.4 F (36.9 C) (Oral)  Resp 20  Ht 5\' 6"  (1.676 m)  Wt 91.173 kg (201 lb)  BMI 32.46 kg/m2      FHT:  FHR: 125 bpm, variability: moderate,  accelerations:  Present,  decelerations:  Absent UC:   irregular, every 8 minutes SVE:   Dilation: 2.5 Effacement (%): 50 Station: -3 Exam by:: Virginia PolesGloria Payne, RN  Labs: Lab Results  Component Value Date   WBC 10.9* 09/09/2013   HGB 11.9* 09/09/2013   HCT 34.7* 09/09/2013   MCV 94.6 09/09/2013   PLT 186 09/09/2013    Assessment / Plan: Augmentation of labor, progressing well  Labor: Progressing well on Pitocin 1x1, will increase to 2x2 Preeclampsia:  n/a Fetal Wellbeing:  Category I Pain Control:  Labor support without medications I/D:  n/a Anticipated MOD:  NSVD  Virginia [name] 09/09/2013, 9:54 PM

## 2013-09-09 NOTE — H&P (Signed)
LABOR ADMISSION HISTORY AND PHYSICAL  Virginia [name] is a 38 y.o. female G2P1001 with IUP at [redacted]w[redacted]d with clear PROM 1130 this morning.  She was a patient of [facility] but did not want to be cared for by one of the cross coverage partners and wanted to transfer her care to [facility]. Please see previous notes for more details.  The nature of the Faculty Practice on call team has been explained to the patient.  She reports +FMs,  no VB, no blurry vision, headaches or peripheral edema, and RUQ pain. She desires an epidural for labor pain control. She plans on breast feeding. She request OCPs for birth control.  Dating: By LMP --->  Estimated Date of Delivery: 09/15/13  Prenatal History/Complications: None, on review of [facility] records  Past Medical History: Past Medical History  Diagnosis Date  . No pertinent past medical history   . IONGEXBM(841.3)     Past Surgical History: Past Surgical History  Procedure Laterality Date  . No past surgeries      Obstetrical History: OB History   Grav Para Term Preterm Abortions TAB SAB Ect Mult Living   2 1 1  0 0 0 0 0 0 1     2012 TSVD - underwent episiotomy, M, 7 lbs. 2015 current  Social History: History   Social History  . Marital Status: Single    Spouse Name: N/A    Number of Children: N/A  . Years of Education: N/A   Social History Main Topics  . Smoking status: Never Smoker   . Smokeless tobacco: None  . Alcohol Use: No  . Drug Use: No  . Sexual Activity: Not Currently    Birth Control/ Protection: None   Other Topics Concern  . None   Social History Narrative  . None    Family History: History reviewed. No pertinent family history.  Allergies: No Known Allergies  Prescriptions prior to admission  Medication Sig Dispense Refill  . folic acid (FOLVITE) 800 MCG tablet Take 800 mcg by mouth daily.        . Omega-3 Fatty Acids (FISH OIL PO) Take 1 capsule by mouth daily.      . Prenatal Vit-Fe Fumarate-FA  (PRENATAL MULTIVITAMIN) TABS tablet Take 1 tablet by mouth daily at 12 noon.         Review of Systems   All systems reviewed and negative except as stated in HPI  Blood pressure 116/62, pulse 88, temperature 98.4 F (36.9 C), temperature source Oral, resp. rate 20, height 5\' 6"  (1.676 m), weight 91.173 kg (201 lb), currently breastfeeding. General appearance: alert Lungs: clear to auscultation bilaterally Heart: regular rate and rhythm Abdomen: soft, non-tender; bowel sounds normal Pelvic: 2/50/-3 ballotable, soft per Dorathy Kinsman Extremities: Homans sign is negative, no sign of DVT Presentation: cephalic Fetal monitoringBaseline: 140 bpm, moderate variability, +accels, no decels Uterine activity q29min, pt feeling rarely  Dilation: 2.5 Effacement (%): 50 Station: -2 Exam by:: [employer], cnm   Prenatal labs: ABO, Rh:  O pos Antibody:  neg Rubella:  immune RPR:  neg  HBsAg:  neg  HIV:  neg  GBS:  neg  1 hr Glucola 79 Genetic screening  Harmony - Normal female  Anatomy US Normal   Prenatal Transfer Tool  Maternal Diabetes: No Genetic Screening: Normal Maternal Ultrasounds/Referrals: Normal Fetal Ultrasounds or other Referrals:  None Maternal Substance Abuse:  No Significant Maternal Medications:  None Significant Maternal Lab Results: Lab values include: Group B Strep negative  Results for orders placed during the hospital encounter of 09/09/13 (from the past 24 hour(s))  CBC   Collection Time    09/09/13  5:10 PM      Result Value Ref Range   WBC 10.9 (*) 4.0 - 10.5 K/uL   RBC 3.67 (*) 3.87 - 5.11 MIL/uL   Hemoglobin 11.9 (*) 12.0 - 15.0 g/dL   HCT 16.134.7 (*) 09.636.0 - 04.546.0 %   MCV 94.6  78.0 - 100.0 fL   MCH 32.4  26.0 - 34.0 pg   MCHC 34.3  30.0 - 36.0 g/dL   RDW 40.915.1  81.111.5 - 91.415.5 %   Platelets 186  150 - 400 K/uL    Patient Active Problem List   Diagnosis Date Noted  . PROM (premature rupture of membranes) 09/09/2013  . Normal delivery 11/22/2010     Assessment: Virginia [name] is a 38 y.o. G2P1001 at 715w1d here for PROM 1130  #Labor: pitocin for augmentation #Pain: epidural #FWB: Cat 1 #ID:  No abx indicated, GBS neg #MOF: breast #MOC: OCPs  ACOSTA,KRISTY ROCIO 09/09/2013, 6:50 PM   Attestation of Attending Supervision of Obstetric Fellow: Evaluation and management procedures were performed by the Obstetric Fellow under my supervision and collaboration.  I have reviewed the Obstetric Fellow's note and chart, and I agree with the management and plan.  Jaynie CollinsUGONNA  Jeannett Dekoning, MD, FACOG Attending Obstetrician & Gynecologist Faculty Practice, [facility] [place]

## 2013-09-09 NOTE — Progress Notes (Signed)
At bedside due to patient refusal of care.  Patient very upset and angry that Dr. Stefano GaulStringer or Nigel BridgemanVicki Latham are not available for her delivery.  She feels "abandoned at her time of need."  Both myself and Gerrit HeckJessica Emly, CNM at bedside to explain that we would be available for her care and reminded the patient that the cross coverage system had been mentioned to her during her prenatal course.  Pt understands, but said she was told they would do their best to accomodate her.  She feels like [place] would be [place].  Patient advised that should she leave the hospital with ruptured membranes- she is at risk for fetal infection and potentially both maternal or fetal death.  Pt is aware of these risks and elects to leave against medical advice.  Myna HidalgoJennifer Achille Xiang, DO [phone]) [phone] (office)

## 2013-09-09 NOTE — Progress Notes (Signed)
In to discuss POC with patient including admission, augmentation, and care providers.  Upon discussing evening providers-J. Beckey Downingxley, CNM & Katharine LookJ. Ozan, DO, patient became upset stating that she would not see any other providers.  Patient requesting that this provider stay or to see Dr.AVS or V.Emilee HeroLatham, CNM.  Explained to patient that, as a practice, we take call and her care is assumed by the providers on call.  Patient not happy with this and given option to see providers in faculty practice, but stated that "those are more strangers."  Patient then stated that she would leave and return in am. Explained to patient that if she leaves it would be against medical advice and that she would not be able to come back under the care of [facility].  Discussed risks and benefits of leaving hospital while ruptured including fetal distress and possibility of fetal death.  Patient states that "if I have to choose between me and this baby I choose me."  Dr. Katharine LookJ. Ozan contacted regarding patient and desires to leave AMA. Dr. Charlotta Newtonzan to room to discuss r/b of AMA discharge.   Laren BoomMLY, Sabatino Williard LYNN,CNM, MSN 09/09/2013, 6:36 PM

## 2013-09-09 NOTE — Progress Notes (Signed)
[facility] and understands that Faculty Practice will be assigned as her physicians.

## 2013-09-10 ENCOUNTER — Inpatient Hospital Stay (HOSPITAL_COMMUNITY): Payer: Medicaid Other | Admitting: Anesthesiology

## 2013-09-10 ENCOUNTER — Encounter (HOSPITAL_COMMUNITY): Payer: Self-pay | Admitting: Anesthesiology

## 2013-09-10 ENCOUNTER — Encounter (HOSPITAL_COMMUNITY): Payer: Medicaid Other | Admitting: Anesthesiology

## 2013-09-10 DIAGNOSIS — O09529 Supervision of elderly multigravida, unspecified trimester: Secondary | ICD-10-CM

## 2013-09-10 DIAGNOSIS — O4290 Premature rupture of membranes, unspecified as to length of time between rupture and onset of labor, unspecified weeks of gestation: Secondary | ICD-10-CM

## 2013-09-10 LAB — ABO/RH: ABO/RH(D): O POS

## 2013-09-10 LAB — RPR

## 2013-09-10 MED ORDER — SIMETHICONE 80 MG PO CHEW: 80.0000 mg | CHEWABLE_TABLET | ORAL | Status: DC | PRN

## 2013-09-10 MED ORDER — LIDOCAINE HCL (PF) 1 % IJ SOLN
INTRAMUSCULAR | Status: DC | PRN
  Administered 2013-09-10 (×2): 4 mL

## 2013-09-10 MED ORDER — ONDANSETRON HCL 4 MG PO TABS: 4.0000 mg | ORAL_TABLET | ORAL | Status: DC | PRN

## 2013-09-10 MED ORDER — ZOLPIDEM TARTRATE 5 MG PO TABS
5.0000 mg | ORAL_TABLET | Freq: Every evening | ORAL | Status: DC | PRN
  Administered 2013-09-11: 5 mg via ORAL
  Filled 2013-09-10: qty 1

## 2013-09-10 MED ORDER — PRENATAL MULTIVITAMIN CH
1.0000 | ORAL_TABLET | Freq: Every day | ORAL | Status: DC
  Administered 2013-09-10 – 2013-09-12 (×3): 1 via ORAL
  Filled 2013-09-10 (×3): qty 1

## 2013-09-10 MED ORDER — IBUPROFEN 600 MG PO TABS
600.0000 mg | ORAL_TABLET | Freq: Four times a day (QID) | ORAL | Status: DC
  Administered 2013-09-10 – 2013-09-12 (×9): 600 mg via ORAL
  Filled 2013-09-10 (×9): qty 1

## 2013-09-10 MED ORDER — FENTANYL 2.5 MCG/ML BUPIVACAINE 1/10 % EPIDURAL INFUSION (WH - ANES)
INTRAMUSCULAR | Status: DC | PRN
  Administered 2013-09-10: 14 mL/h via EPIDURAL

## 2013-09-10 MED ORDER — SENNOSIDES-DOCUSATE SODIUM 8.6-50 MG PO TABS
2.0000 | ORAL_TABLET | ORAL | Status: DC
  Administered 2013-09-11 (×2): 2 via ORAL
  Filled 2013-09-10 (×2): qty 2

## 2013-09-10 MED ORDER — BENZOCAINE-MENTHOL 20-0.5 % EX AERO
1.0000 "application " | INHALATION_SPRAY | CUTANEOUS | Status: DC | PRN
  Administered 2013-09-10 – 2013-09-12 (×4): 1 via TOPICAL
  Filled 2013-09-10 (×4): qty 56

## 2013-09-10 MED ORDER — WITCH HAZEL-GLYCERIN EX PADS: 1.0000 "application " | MEDICATED_PAD | CUTANEOUS | Status: DC | PRN

## 2013-09-10 MED ORDER — OXYCODONE-ACETAMINOPHEN 5-325 MG PO TABS
1.0000 | ORAL_TABLET | ORAL | Status: DC | PRN
  Administered 2013-09-10: 1 via ORAL
  Administered 2013-09-10: 2 via ORAL
  Administered 2013-09-10: 1 via ORAL
  Administered 2013-09-11 – 2013-09-12 (×5): 2 via ORAL
  Filled 2013-09-10: qty 2
  Filled 2013-09-10: qty 1
  Filled 2013-09-10 (×6): qty 2

## 2013-09-10 MED ORDER — LANOLIN HYDROUS EX OINT: TOPICAL_OINTMENT | CUTANEOUS | Status: DC | PRN

## 2013-09-10 MED ORDER — TETANUS-DIPHTH-ACELL PERTUSSIS 5-2.5-18.5 LF-MCG/0.5 IM SUSP: 0.5000 mL | Freq: Once | INTRAMUSCULAR | Status: DC

## 2013-09-10 MED ORDER — ONDANSETRON HCL 4 MG/2ML IJ SOLN: 4.0000 mg | INTRAMUSCULAR | Status: DC | PRN

## 2013-09-10 MED ORDER — DIBUCAINE 1 % RE OINT: 1.0000 "application " | TOPICAL_OINTMENT | RECTAL | Status: DC | PRN

## 2013-09-10 MED ORDER — DIPHENHYDRAMINE HCL 25 MG PO CAPS: 25.0000 mg | ORAL_CAPSULE | Freq: Four times a day (QID) | ORAL | Status: DC | PRN

## 2013-09-10 NOTE — Progress Notes (Signed)
Patient ID: Virginia [name] KPOH-RETVIG, female   DOB: 12/27/1975, 38 y.o.   MRN: [id number]  Requesting epidural  Filed Vitals:   09/09/13 2308 09/09/13 2354 09/10/13 0036 09/10/13 0118  BP: 121/71 121/72 134/83 118/72  Pulse: 76 74 72 74  Temp:      TempSrc:      Resp:   18   Height:      Weight:       FHR reactive UCs every 2-3 minutes 120-130 MVU/5710min  Hypotonic UCs  >> continue to increase Pitocin

## 2013-09-10 NOTE — Progress Notes (Signed)
Patient ID: Virginia [name], female   DOB: 1975/04/19, 38 y.o.   MRN: [id number] Comfortable with epidural  Filed Vitals:   09/10/13 0430 09/10/13 0456 09/10/13 0501 09/10/13 0531  BP: 111/71 113/66 118/70 116/79  Pulse: 64 63 56 80  Temp:      TempSrc:      Resp: 18 18 18 18   Height:      Weight:       FHR reassuring with some small variable decels with UCs UCs adequate  Cervix complete / 0 station  Will labor down and then push

## 2013-09-10 NOTE — Anesthesia Preprocedure Evaluation (Signed)

## 2013-09-10 NOTE — Progress Notes (Signed)
Patient ID: Virginia [name] KPOH-RETVIG, female   DOB: 1975-12-29, 38 y.o.   MRN: [id number] STarting to get a little more uncomfortable  Just now has developed some vaginal bleeding with clots.   FHR reactive with accels UCs every 2-3 min  IUPC inserted  Dilation: 2.5 Effacement (%): 50 Cervical Position: Posterior Station: -3 Presentation: Vertex Exam by:: Rosebud PolesGloria Payne, RN  Will continue to increase Pitocin.

## 2013-09-10 NOTE — Anesthesia Procedure Notes (Signed)
Epidural Patient location during procedure: OB Start time: 09/10/2013 2:26 AM  Staffing Anesthesiologist: Shirle Provencal A.  Preanesthetic Checklist Completed: patient identified, site marked, surgical consent, pre-op evaluation, timeout performed, IV checked, risks and benefits discussed and monitors and equipment checked  Epidural Patient position: sitting Prep: site prepped and draped and DuraPrep Patient monitoring: continuous pulse ox and blood pressure Approach: midline Location: L3-L4 Injection technique: LOR air  Needle:  Needle type: Tuohy  Needle gauge: 17 G Needle length: 9 cm and 9 Needle insertion depth: 4 cm Catheter type: closed end flexible Catheter size: 19 Gauge Catheter at skin depth: 9 cm Test dose: negative and Other  Assessment Events: blood not aspirated, injection not painful, no injection resistance, negative IV test and no paresthesia  Additional Notes Patient identified. Risks and benefits discussed including failed block, incomplete  Pain control, post dural puncture headache, nerve damage, paralysis, blood pressure Changes, nausea, vomiting, reactions to medications-both toxic and allergic and post Partum back pain. All questions were answered. Patient expressed understanding and wished to proceed. Sterile technique was used throughout procedure. Epidural site was Dressed with sterile barrier dressing. No paresthesias, signs of intravascular injection Or signs of intrathecal spread were encountered.  Patient was more comfortable after the epidural was dosed. Please see RN's note for documentation of vital signs and FHR which are stable.

## 2013-09-10 NOTE — Lactation Note (Signed)
This note was copied from the chart of Virginia [name]. Lactation Consultation Note  Patient Name: Virginia Darcia [name] Today's Date: 09/10/2013  Mom is experienced BF, denies questions or concerns. Some basic teaching reviewed. Lactation brochure left for review, advised of OP services and support group. Encouraged Mom to call if she would like LC assist.   Maternal Data    Feeding Feeding Type: Breast Fed  [facility]ATCH Score/Interventions                      Lactation Tools Discussed/Used     Consult Status      Alfred LevinsGranger, Sharnese Heath Ann 09/10/2013, 7:49 PM

## 2013-09-10 NOTE — Progress Notes (Signed)
Patient C/O itching.

## 2013-09-10 NOTE — Progress Notes (Signed)
Acknowledged MD order to address statement made by mother while in the delivery room "If I had to choose between myself and the baby, then I would choose myself".  Spoke with RN caring for mother and informed that she has been very appropriate during her shift and the previous shift.  Met with mother.  She was pleasant and receptive to social work intervention.  Informed that she did not have a good experience with the last pregnancy because "they did not put me back together correctly" and as a result, she had a lot of complications.  Mother states that when she got pregnant the second time, she did a lot of research on different practices before settling on the group she selected.  Informed that she was assured that someone from the practice would deliver her baby.  She went on to explain that when she arrived at the hospital, she initially say a midwife that she did not recognize but reminded her that she had seen her before, and she felt OK.  However, that person was leaving and the Physician who replace her was not  from the practice and this was very upsetting to her.  She talked about how emotional she was and anxious because of the previous delivery experience.  Patient stated that she felt very vulnerable, alone, and outnumbered.   She admits to making that statement and stated that she felt the medical team was using her situation to threaten her and it was the most reasonable response she could come up with, and also, she just wanted the person to leave her room.  Informed that after the exchange, she went on-line and looked up the house coverage for [place] and was comfortable with the reviews.  She then asked the nurse if they would see her and she was relieved when they agreed to.  Mother notes that it was a wonderful experience and she has been please with the care she has received since.  She also shared that her first husband left her because they could not have children.  And, she was shocked  when she got pregnant the first time.  Mother states that she love her children very much and would do anything to protect them.  She reports no hx of mental illness and seemed to have good support.  There were two visitors when CSW arrived that left the room to give mother privacy, and several were waiting outside the room when this writer left.   Mother informed of CSW availability.  Provided supportive feedback throughout the visit.  

## 2013-09-11 NOTE — Transfer of Care (Signed)
Immediate Anesthesia Transfer of Care Note  Patient: Virginia [name]  Procedure(s) Performed: * No procedures listed *  Patient Location: PACU and Mother/Baby  Anesthesia Type:Epidural  Level of Consciousness: awake, alert  and oriented  Airway & Oxygen Therapy: Patient Spontanous Breathing  Post-op Assessment: Report given to PACU RN and Post -op Vital signs reviewed and stable  Post vital signs: Reviewed and stable  Complications: No apparent anesthesia complications

## 2013-09-11 NOTE — Progress Notes (Signed)
Post Partum Day 1  Subjective: no complaints, tolerating PO and breastfeeding, desires condoms for family planning.  Reports decreased bleeding and pain controlled with ibuprofen.    Objective: Blood pressure 106/59, pulse 63, temperature 98.4 F (36.9 C), temperature source Oral, resp. rate 18, height 5\' 6"  (1.676 m), weight 91.173 kg (201 lb), unknown if currently breastfeeding.  Physical Exam:  Filed Vitals:   09/11/13 0605  BP: 106/59  Pulse: 63  Temp: 98.4 F (36.9 C)  Resp: 18   General: alert, cooperative and appears stated age Lochia: appropriate Uterine Fundus: firm Incision: n/a DVT Evaluation: No evidence of DVT seen on physical exam. Negative Homan's sign.   Recent Labs  09/09/13 1710  HGB 11.9*  HCT 34.7*    Assessment/Plan: Plan for discharge tomorrow and Breastfeeding   LOS: 2 days   Marlis EdelsonKARIM, Irelyn Perfecto N 09/11/2013, 7:38 AM

## 2013-09-11 NOTE — Lactation Note (Signed)
This note was copied from the chart of Virginia Navayah [name]. Lactation Consultation Note: Mom called for assist - wants to give a little formula because baby has been feeding since 4 am. Wants to breast feed and nursed her first baby for 1 yr. Easily able to hand express Colostrum. Baby going off to sleep. Encouraged to wait to give formula. Suggested spoon feeding Colostrum but mom just puts baby back to breast. She nursed for 5 minutes then off to sleep. Lots of swallows noted. Baby placed in bassinet off to sleep. To call for assist prn  Patient Name: Virginia [name] Today's Date: 09/11/2013 Reason for consult: Follow-up assessment   Maternal Data    Feeding Feeding Type: Breast Fed Length of feed: 3 min  LATCH Score/Interventions Latch: Repeated attempts needed to sustain latch, nipple held in mouth throughout feeding, stimulation needed to elicit sucking reflex.  Audible Swallowing: Spontaneous and intermittent  Type of Nipple: Everted at rest and after stimulation  Comfort (Breast/Nipple): Soft / non-tender     Hold (Positioning): No assistance needed to correctly position infant at breast.  LATCH Score: 9  Lactation Tools Discussed/Used     Consult Status Consult Status: Follow-up Date: 09/12/13 Follow-up type: In-patient    Pamelia HoitWeeks, Carrye Goller D 09/11/2013, 11:22 AM

## 2013-09-11 NOTE — Progress Notes (Signed)
UR chart review completed.  

## 2013-09-11 NOTE — Anesthesia Postprocedure Evaluation (Signed)
  Anesthesia Post-op Note  Patient: Virginia [name]  Procedure(s) Performed: * No procedures listed *  Patient Location: PACU and Mother/Baby  Anesthesia Type:Epidural  Level of Consciousness: awake, alert  and oriented  Airway and Oxygen Therapy: Patient Spontanous Breathing  Post-op Pain: mild  Post-op Assessment: Patient's Cardiovascular Status Stable, Respiratory Function Stable, No signs of Nausea or vomiting, Adequate PO intake, Pain level controlled and No headache  Post-op Vital Signs: Reviewed and stable  Last Vitals:  Filed Vitals:   09/11/13 0605  BP: 106/59  Pulse: 63  Temp: 36.9 C  Resp: 18    Complications: No apparent anesthesia complications

## 2013-09-12 MED ORDER — IBUPROFEN 600 MG PO TABS: 600.0000 mg | ORAL_TABLET | Freq: Four times a day (QID) | ORAL | Status: DC

## 2013-09-12 NOTE — Discharge Summary (Signed)
Obstetric Discharge Summary Reason for Admission: rupture of membranes Prenatal Procedures: NST Intrapartum Procedures: spontaneous vaginal delivery Postpartum Procedures: none Complications-Operative and Postpartum: First degree perineal laceration Hemoglobin  Date Value Ref Range Status  09/09/2013 11.9* 12.0 - 15.0 [name]/dL Final     HCT  Date Value Ref Range Status  09/09/2013 34.7* 36.0 - 46.0 % Final  Hospital Course: Pt. Was initially seen in the MAU as she requested to be seen by house coverage rather than her previous physician group. She was found to be PROM status, and admitted to L&D. There she was given an epidural, and received augmentation of labor with pitocin. She progressed to NSVD appropriately without complications. She had an uncomplicated intrapartum course. She is now postpartum day two and is ambulating, pain well controlled, tolerating po, and has no complaints. She is stable and ready for discharge. Of note, pt. Was transferred to faculty practice care per patient request during this hospitalization. She  Would like to follow up with faculty practice for her future OB/GYN care.   Delivery Note  At 7:27 AM a viable and healthy female was delivered via Vaginal, Spontaneous Delivery (Presentation: Left Occiput Anterior). APGAR: 9, 9; weight .  Placenta status: Intact, Spontaneous. Cord: 3 vessels with the following complications: None.  Anesthesia: Epidural  Episiotomy: None  Lacerations: 1st degree Shallow but circled from center of introitus up side to left.  Suture Repair: 3.0 monocryl  Est. Blood Loss (mL): 200  Mom to postpartum. Baby to Couplet care / Skin to Skin.  [facility] SystemWILLIAMS,MARIE  09/10/2013, 7:44 AM   Physical Exam:  General: alert, cooperative and no distress Lochia: appropriate Uterine Fundus: firm Incision: N/A DVT Evaluation: No evidence of DVT seen on physical exam. No cords or calf tenderness.  Discharge Diagnoses: Term  Pregnancy-delivered  Discharge Information: Date: 09/12/2013 Activity: unrestricted and pelvic rest Diet: routine Medications: PNV and Ibuprofen Condition: stable Instructions: refer to practice specific booklet Discharge to: home Follow-up Information   [facility] [place] [facility] . Schedule an appointment as soon as possible for a visit in 4 weeks. (Postpartum Follow Up )       [facility]. Schedule an appointment as soon as possible for a visit in 4 weeks. (For postpartum follow up and establishment of care. )    Specialty:  Obstetrics and Gynecology   Contact information:   [address] [place] [id number] [phone]      Newborn Data: Live born female  Birth Weight: 6 lb 4.9 oz (2860 [name]) APGAR: 9, 9  Home with mother.  Virginia [name] 09/12/2013, 11:22 AM

## 2013-09-12 NOTE — Discharge Summary (Signed)
Attestation of Attending Supervision of Resident: Evaluation and management procedures were performed by the Family Medicine Resident under my supervision.  I have seen and examined the patient, reviewed the resident's note and chart, and I agree with the management and plan.  Donesha Wallander, MD, FACOG Attending Obstetrician & Gynecologist Faculty Practice, [facility]    

## 2013-09-12 NOTE — Discharge Instructions (Signed)
Before Baby Comes Home °Ask any questions about feeding, diapering, and baby care before you leave the hospital. Ask again if you do not understand. Ask when you need to see the doctor again. °There are several things you must have before your baby comes home. °· Infant car seat. °· Crib. °¨ Do not let your baby sleep in a bed with you or anyone else. °¨ If you do not have a bed for your baby, ask the doctor what you can use that will be safe for the baby to sleep in. °Infant feeding supplies: °· 6 to 8 bottles (8 ounce size). °· 6 to 8 nipples. °· Measuring cup. °· Measuring tablespoon. °· Bottle brush. °· Sterilizer (or use any large pan or kettle with a lid). °· Formula that contains iron. °· A way to boil and cool water. °Breastfeeding supplies: °· Breast pump. °· Nipple cream. °Clothing: °· 24 to 36 cloth diapers and waterproof diaper covers or a box of disposable diapers. You may need as many as 10 to 12 diapers per day. °· 3 onesies (other clothing will depend on the time of year and the weather). °· 3 receiving blankets. °· 3 baby pajamas or gowns. °· 3 bibs. °Bath equipment: °· Mild soap. °· Petroleum jelly. No baby oil or powder. °· Soft cloth towel and washcloth. °· Cotton balls. °· Separate bath basin for baby. Only sponge bathe until umbilical cord and circumcision are healed. °Other supplies: °· Thermometer and bulb syringe (ask the hospital to send them home with you). Ask your doctor about how you should take your baby's temperature. °· One to two pacifiers. °Prepare for an emergency: °· Know how to get to the hospital and know where to admit your baby. °· Put all doctor numbers near your house phone and in your cell phone if you have one. °Prepare your family: °· Talk with siblings about the baby coming home and how they feel about it. °· Decide how you want to handle visitors and other family members. °· Take offers for help with the baby. You will need time to adjust. °Know when to call the  doctor.  °GET HELP RIGHT AWAY IF: °· Your baby's temperature is greater than 100.4°F (38°C). °· The soft spot on your baby's head starts to bulge. °· Your baby is crying with no tears or has no wet diapers for 6 hours. °· Your baby has rapid breathing. °· Your baby is not as alert. °Document Released: 01/16/2008 Document Revised: 06/19/2013 Document Reviewed: 04/24/2010 °ExitCare® Patient Information ©2015 [employer]. This information is not intended to replace advice given to you by your health care provider. Make sure you discuss any questions you have with your health care provider. °Vaginal Delivery, Care After °Refer to this sheet in the next few weeks. These discharge instructions provide you with information on caring for yourself after delivery. Your caregiver may also give you specific instructions. Your treatment has been planned according to the most current medical practices available, but problems sometimes occur. Call your caregiver if you have any problems or questions after you go home. °HOME CARE INSTRUCTIONS °· Take over-the-counter or prescription medicines only as directed by your caregiver or pharmacist. °· Do not drink alcohol, especially if you are breastfeeding or taking medicine to relieve pain. °· Do not chew or smoke tobacco. °· Do not use illegal drugs. °· Continue to use good perineal care. Good perineal care includes: °¨ Wiping your perineum from front to back. °¨ Keeping your   perineum clean. °· Do not use tampons or douche until your caregiver says it is okay. °· Shower, wash your hair, and take tub baths as directed by your caregiver. °· Wear a well-fitting bra that provides breast support. °· Eat healthy foods. °· Drink enough fluids to keep your urine clear or pale yellow. °· Eat high-fiber foods such as whole grain cereals and breads, brown rice, beans, and fresh fruits and vegetables every day. These foods may help prevent or relieve constipation. °· Follow your caregiver's  recommendations regarding resumption of activities such as climbing stairs, driving, lifting, exercising, or traveling. °· Talk to your caregiver about resuming sexual activities. Resumption of sexual activities is dependent upon your risk of infection, your rate of healing, and your comfort and desire to resume sexual activity. °· Try to have someone help you with your household activities and your newborn for at least a few days after you leave the hospital. °· Rest as much as possible. Try to rest or take a nap when your newborn is sleeping. °· Increase your activities gradually. °· Keep all of your scheduled postpartum appointments. It is very important to keep your scheduled follow-up appointments. At these appointments, your caregiver will be checking to make sure that you are healing physically and emotionally. °SEEK MEDICAL CARE IF:  °· You are passing large clots from your vagina. Save any clots to show your caregiver. °· You have a foul smelling discharge from your vagina. °· You have trouble urinating. °· You are urinating frequently. °· You have pain when you urinate. °· You have a change in your bowel movements. °· You have increasing redness, pain, or swelling near your vaginal incision (episiotomy) or vaginal tear. °· You have pus draining from your episiotomy or vaginal tear. °· Your episiotomy or vaginal tear is separating. °· You have painful, hard, or reddened breasts. °· You have a severe headache. °· You have blurred vision or see spots. °· You feel sad or depressed. °· You have thoughts of hurting yourself or your newborn. °· You have questions about your care, the care of your newborn, or medicines. °· You are dizzy or light-headed. °· You have a rash. °· You have nausea or vomiting. °· You were breastfeeding and have not had a menstrual period within 12 weeks after you stopped breastfeeding. °· You are not breastfeeding and have not had a menstrual period by the 12th week after  delivery. °· You have a fever. °SEEK IMMEDIATE MEDICAL CARE IF:  °· You have persistent pain. °· You have chest pain. °· You have shortness of breath. °· You faint. °· You have leg pain. °· You have stomach pain. °· Your vaginal bleeding saturates two or more sanitary pads in 1 hour. °MAKE SURE YOU:  °· Understand these instructions. °· Will watch your condition. °· Will get help right away if you are not doing well or get worse. °Document Released: 01/31/2000 Document Revised: 06/19/2013 Document Reviewed: 09/30/2011 °ExitCare® Patient Information ©2015 [employer]. This information is not intended to replace advice given to you by your health care provider. Make sure you discuss any questions you have with your health care provider. ° °

## 2013-10-24 ENCOUNTER — Ambulatory Visit (INDEPENDENT_AMBULATORY_CARE_PROVIDER_SITE_OTHER): Payer: Medicaid Other | Admitting: Obstetrics & Gynecology

## 2013-10-24 ENCOUNTER — Encounter: Payer: Self-pay | Admitting: Obstetrics & Gynecology

## 2013-10-24 NOTE — Patient Instructions (Signed)
Return to clinic for any scheduled appointments or for any gynecologic concerns as needed.   

## 2013-10-24 NOTE — Progress Notes (Signed)
    Subjective:     Virginia [name] is a 38 y.o. G43P2002 female who presents for a postpartum visit. She is 6 weeks postpartum following a spontaneous vaginal delivery. I have fully reviewed the prenatal and intrapartum course. The delivery was at 39 gestational weeks.  Anesthesia: epidural. Postpartum course has been uncomplicated. Baby's course has been uncomplicated. Baby is feeding by breast. Currently started menstrual period. Bowel function is normal. Bladder function is normal. Patient is not sexually active. Contraception method is condoms. Postpartum depression screening: negative.  The following portions of the patient's history were reviewed and updated as appropriate: allergies, current medications, past family history, past medical history, past social history, past surgical history and problem list.  Normal pap smear in January 2015 at [facility], will obtain records.  Review of Systems Pertinent items are noted in HPI.   Objective:    BP 127/93  Pulse 64  Ht  (1.676 m)  Wt 193 lb (87.544 kg)  BMI 31.17 kg/m2  Breastfeeding? Yes  General:  alert and no distress   Breasts:  inspection negative, no nipple discharge or bleeding, no masses or nodularity palpable  Lungs: clear to auscultation bilaterally  Heart:  regular rate and rhythm  Abdomen: soft, non-tender; bowel sounds normal; no masses,  no organomegaly   Vulva:  normal  Vagina: normal vagina  Cervix:  multiparous appearance  Corpus: normal size, contour, position, consistency, mobility, non-tender  Adnexa:  normal adnexa and no mass, fullness, tenderness  Rectal Exam: Normal rectovaginal exam      Assessment:   Normal postpartum exam. Pap smear not done at today's visit.  Plan:   1. Contraception: condoms 2. Follow up as needed.    Jaynie Collins, MD, FACOG Attending Obstetrician & Gynecologist Center for [employer], [facility]

## 2013-12-18 ENCOUNTER — Encounter: Payer: Self-pay | Admitting: Obstetrics & Gynecology

## 2015-01-20 ENCOUNTER — Inpatient Hospital Stay (HOSPITAL_COMMUNITY): Payer: Medicaid Other

## 2015-01-20 ENCOUNTER — Inpatient Hospital Stay (HOSPITAL_COMMUNITY)
Admission: AD | Admit: 2015-01-20 | Discharge: 2015-01-20 | Disposition: A | Discharge status: Home / Self Care | Payer: Medicaid Other | Source: Ambulatory Visit | Attending: Obstetrics & Gynecology | Admitting: Obstetrics & Gynecology

## 2015-01-20 ENCOUNTER — Encounter (HOSPITAL_COMMUNITY): Payer: Self-pay | Admitting: *Deleted

## 2015-01-20 DIAGNOSIS — O4691 Antepartum hemorrhage, unspecified, first trimester: Secondary | ICD-10-CM

## 2015-01-20 DIAGNOSIS — O209 Hemorrhage in early pregnancy, unspecified: Secondary | ICD-10-CM

## 2015-01-20 DIAGNOSIS — O200 Threatened abortion: Secondary | ICD-10-CM | POA: Diagnosis present

## 2015-01-20 LAB — URINALYSIS, ROUTINE W REFLEX MICROSCOPIC
BILIRUBIN URINE: NEGATIVE
GLUCOSE, UA: NEGATIVE mg/dL
Ketones, ur: NEGATIVE mg/dL
Leukocytes, UA: NEGATIVE
Nitrite: NEGATIVE
PROTEIN: NEGATIVE mg/dL
Specific Gravity, Urine: <1.005 — ABNORMAL LOW (ref 1.005–1.030)
pH: 6 (ref 5.0–8.0)

## 2015-01-20 LAB — CBC
HCT: 37.5 % (ref 36.0–46.0)
Hemoglobin: 12.9 g/dL (ref 12.0–15.0)
MCH: 30.9 pg (ref 26.0–34.0)
MCHC: 34.4 g/dL (ref 30.0–36.0)
MCV: 89.9 fL (ref 78.0–100.0)
PLATELETS: 257 10*3/uL (ref 150–400)
RBC: 4.17 MIL/uL (ref 3.87–5.11)
RDW: 13.8 % (ref 11.5–15.5)
WBC: 8.7 10*3/uL (ref 4.0–10.5)

## 2015-01-20 LAB — URINE MICROSCOPIC-ADD ON

## 2015-01-20 LAB — POCT PREGNANCY, URINE: PREG TEST UR: POSITIVE — AB

## 2015-01-20 LAB — HCG, QUANTITATIVE, PREGNANCY: HCG, BETA CHAIN, QUANT, S: 33660 m[IU]/mL — AB (ref <5)

## 2015-01-20 NOTE — MAU Provider Note (Signed)
History     CSN: [id number]  Arrival date and time: 01/20/15 1045   Seen by provider at 1130    Chief Complaint  Patient presents with  . Vaginal Bleeding   HPI Virginia [name] 39 y.o. [redacted]w[redacted]d Comes to MAU with a large amount of painless bleeding at home today.  Is worried about having a miscarriage.  Wants this pregnancy.  Asking for treatment to save the pregnancy.  No prenatal care yet.  Thinks she is 8 weeks but LMP is uncertain.  Had a mild cramp 2 days ago, then small amount of spotting when wiping yesterday and today has had a large amount of painless bleeding at home today.  OB History    Gravida Para Term Preterm AB TAB SAB Ectopic Multiple Living   0 0 0 0 0 0 2      Past Medical History  Diagnosis Date  . [id number](478.2)     Past Surgical History  Procedure Laterality Date  . No past surgeries      History reviewed. No pertinent family history.  Social History  Substance Use Topics  . Smoking status: Never Smoker   . Smokeless tobacco: None  . Alcohol Use: No    Allergies: No Known Allergies  Prescriptions prior to admission  Medication Sig Dispense Refill Last Dose  . folic acid (FOLVITE) 1 MG tablet Take 1 mg by mouth daily.   01/20/2015 at Unknown time  . Omega-3 Fatty Acids (FISH OIL PO) Take 1 capsule by mouth daily.   01/20/2015 at Unknown time  . Prenatal Vit-Fe Fumarate-FA (PRENATAL MULTIVITAMIN) TABS tablet Take 1 tablet by mouth daily at 12 noon.   01/20/2015 at Unknown time  . ibuprofen (ADVIL,MOTRIN) 600 MG tablet Take 1 tablet (600 mg total) by mouth every 6 (six) hours. (Patient not taking: Reported on 01/20/2015) 30 tablet 0 Not Taking    Review of Systems  Constitutional: Negative for fever.  Gastrointestinal: Negative for nausea, vomiting and abdominal pain.  Genitourinary:       Vaginal bleeding No dysuria   Physical Exam   Blood pressure 112/87, pulse 83, temperature 98.1 F (36.7 C), temperature source Oral, resp.  rate 18, height  (1.676 m), weight 170 lb (77.111 kg), last menstrual period 11/18/2014, currently breastfeeding.  Physical Exam  Nursing note and vitals reviewed. Constitutional: She is oriented to person, place, and time. She appears well-developed and well-nourished.  Tearful and wants treatment so she does not lose the baby.  HENT:  Head: Normocephalic.  Eyes: EOM are normal.  Neck: Neck supple.  Respiratory: Effort normal.  Musculoskeletal: Normal range of motion.  Neurological: She is alert and oriented to person, place, and time.  Skin: Skin is warm and dry.  Psychiatric: She has a normal mood and affect.    MAU Course  Procedures Results for orders placed or performed during the hospital encounter of 01/20/15 (from the past 24 hour(s))  Urinalysis, Routine w reflex microscopic (not at [facility])     Status: Abnormal   Collection Time: 01/20/15 11:10 AM  Result Value Ref Range   Color, Urine YELLOW YELLOW   APPearance CLEAR CLEAR   Specific Gravity, Urine <1.005 (L) 1.005 - 1.030   pH 6.0 5.0 - 8.0   Glucose, UA NEGATIVE NEGATIVE mg/dL   Hgb urine dipstick MODERATE (A) NEGATIVE   Bilirubin Urine NEGATIVE NEGATIVE   Ketones, ur NEGATIVE NEGATIVE mg/dL   Protein, ur NEGATIVE NEGATIVE mg/dL   Nitrite  NEGATIVE NEGATIVE   Leukocytes, UA NEGATIVE NEGATIVE  Urine microscopic-add on     Status: Abnormal   Collection Time: 01/20/15 11:10 AM  Result Value Ref Range   Squamous Epithelial / LPF 0-5 (A) NONE SEEN   WBC, UA 0-5 0 - 5 WBC/hpf   RBC / HPF 0-5 0 - 5 RBC/hpf   Bacteria, UA FEW (A) NONE SEEN  Pregnancy, urine POC     Status: Abnormal   Collection Time: 01/20/15 11:58 AM  Result Value Ref Range   Preg Test, Ur POSITIVE (A) NEGATIVE  CBC     Status: None   Collection Time: 01/20/15 12:25 PM  Result Value Ref Range   WBC 8.7 4.0 - 10.5 K/uL   RBC 4.17 3.87 - 5.11 MIL/uL   Hemoglobin 12.9 12.0 - 15.0 g/dL   HCT 16.137.5 09.636.0 - 04.546.0 %   MCV 89.9 78.0 - 100.0 fL   MCH  30.9 26.0 - 34.0 pg   MCHC 34.4 30.0 - 36.0 g/dL   RDW 40.913.8 81.111.5 - 91.415.5 %   Platelets 257 150 - 400 K/uL  hCG, quantitative, pregnancy     Status: Abnormal   Collection Time: 01/20/15 12:25 PM  Result Value Ref Range   hCG, Beta Chain, Quant, S 33660 (H) <5 mIU/mL    MDM Prior to ultrasound, discussed that the evaluation for a healthy pregnancy will include lab work and ultrasound and there is no treatment at this time to stop the bleeding or to make the pregnancy be OK. Reviewed the ultrasound report.  Reviewed with Dr. Penne LashLeggett.  Dr. Penne LashLeggett in and discussed the ultrasound results and plan of care with client and her husband. Blood Type O positive  Assessment and Plan  IUGS at 6754w3d and moderate subchorionic hemorrhage Bleeding in pregnancy - threatened miscarriage  Plan Repeat quant in the clinic on Tuesday morning. If the Sharene Buttersquant is rising, will schedule US in 12 days from Tuesday. Rest at home, try not to worry, take a multivitamin, return on Tuesday, no smoking, no drugs, no alcohol. Message sent to clinic.  Virginia [name] 01/20/2015, 1:07 PM

## 2015-01-20 NOTE — Discharge Instructions (Signed)
Return on Tuesday morning for repeat labs in the clinic on the ground floor. Return sooner if you have heavy vaginal bleeding. Rest, try not to worry, take your vitamins, no intercourse, drink lots of water, and do not do heavy lifting or strenuous activity.

## 2015-01-20 NOTE — MAU Note (Signed)
Patient presents stating that she is [redacted] weeks pregnant with c/o vaginal bleeding since this morning. Denies pain.

## 2015-01-22 ENCOUNTER — Other Ambulatory Visit: Payer: Medicaid Other

## 2015-01-22 ENCOUNTER — Telehealth: Payer: Self-pay | Admitting: *Deleted

## 2015-01-22 DIAGNOSIS — O200 Threatened abortion: Secondary | ICD-10-CM

## 2015-01-22 LAB — HCG, QUANTITATIVE, PREGNANCY: hCG, Beta Chain, Quant, S: 13184 m[IU]/mL — ABNORMAL HIGH (ref <5)

## 2015-01-22 NOTE — Progress Notes (Unsigned)
Patient here for stat bhcg. She reports heavy bleeding and cramping last night.States is light bleeding now.  Informed her we would draw stat bhcg and call results today .  Discussed with Dr. Shawnie PonsPratt and will proceed with stat bhcg.

## 2015-01-22 NOTE — Telephone Encounter (Signed)
Received results of her bhcg from Dr. Shawnie PonsPratt. Called Virginia [name] to notify her that her hormone levels have dropped from 36000 to 13000 which means it is likely she is miscarrying. She voices understanding. I advised her to come back to MAU if she has severe pain or heavy bleeding.

## 2015-01-23 ENCOUNTER — Telehealth: Payer: Self-pay

## 2015-01-23 NOTE — Telephone Encounter (Signed)
Per Dr. Shawnie PonsPratt, pt beta levels indicate SAB.  Called pt with results and advised that she come in one week for rpt beta draw to monitor her levels to get to less than two.  Pt agreed and stated that she will be able to come in on 01/28/15 @ 0900.  Message sent to [employer] staff for scheduling.

## 2015-01-28 ENCOUNTER — Other Ambulatory Visit: Payer: Medicaid Other

## 2015-01-28 DIAGNOSIS — O039 Complete or unspecified spontaneous abortion without complication: Secondary | ICD-10-CM

## 2015-01-29 ENCOUNTER — Other Ambulatory Visit (HOSPITAL_COMMUNITY): Payer: Self-pay | Admitting: Obstetrics

## 2015-01-29 ENCOUNTER — Telehealth: Payer: Self-pay | Admitting: *Deleted

## 2015-01-29 DIAGNOSIS — O3680X0 Pregnancy with inconclusive fetal viability, not applicable or unspecified: Secondary | ICD-10-CM

## 2015-01-29 DIAGNOSIS — O2620 Pregnancy care for patient with recurrent pregnancy loss, unspecified trimester: Secondary | ICD-10-CM

## 2015-01-29 LAB — HCG, QUANTITATIVE, PREGNANCY: HCG, BETA CHAIN, QUANT, S: 696.7 m[IU]/mL — AB

## 2015-01-29 NOTE — Telephone Encounter (Signed)
Per Dr Adrian BlackwaterStinson, hcg still elevated needs repeat in 2 weeks. Called patient and left message that we are calling with results.

## 2015-01-31 ENCOUNTER — Encounter: Payer: Self-pay | Admitting: General Practice

## 2015-01-31 ENCOUNTER — Other Ambulatory Visit (HOSPITAL_COMMUNITY): Payer: Self-pay | Admitting: Obstetrics

## 2015-01-31 ENCOUNTER — Ambulatory Visit (HOSPITAL_COMMUNITY)
Admission: RE | Admit: 2015-01-31 | Discharge: 2015-01-31 | Disposition: A | Discharge status: Home / Self Care | Payer: Medicaid Other | Source: Ambulatory Visit | Attending: Obstetrics | Admitting: Obstetrics

## 2015-01-31 DIAGNOSIS — O3680X0 Pregnancy with inconclusive fetal viability, not applicable or unspecified: Secondary | ICD-10-CM

## 2015-01-31 DIAGNOSIS — Z3A00 Weeks of gestation of pregnancy not specified: Secondary | ICD-10-CM | POA: Insufficient documentation

## 2015-01-31 DIAGNOSIS — O4690 Antepartum hemorrhage, unspecified, unspecified trimester: Secondary | ICD-10-CM | POA: Diagnosis not present

## 2015-01-31 NOTE — Telephone Encounter (Signed)
Called patient at both numbers, no answer- left messages to call us back at the clinics from results. Will send letter

## 2015-02-13 ENCOUNTER — Other Ambulatory Visit: Payer: Medicaid Other

## 2015-02-13 DIAGNOSIS — Z8759 Personal history of other complications of pregnancy, childbirth and the puerperium: Secondary | ICD-10-CM

## 2015-02-14 LAB — HCG, QUANTITATIVE, PREGNANCY: hCG, Beta Chain, Quant, S: 6.9 m[IU]/mL — ABNORMAL HIGH

## 2015-02-25 ENCOUNTER — Telehealth: Payer: Self-pay | Admitting: General Practice

## 2015-02-25 NOTE — Telephone Encounter (Signed)
Patient's bhcg shows complete pregnancy, no further checks are needed per Dr Debroah LoopArnold. Called patient and informed her of results. Patient verbalized understanding & had no questions

## 2015-11-25 ENCOUNTER — Encounter (HOSPITAL_COMMUNITY): Payer: Self-pay

## 2015-12-26 ENCOUNTER — Encounter: Payer: Self-pay | Admitting: *Deleted

## 2015-12-26 ENCOUNTER — Ambulatory Visit (INDEPENDENT_AMBULATORY_CARE_PROVIDER_SITE_OTHER): Payer: Self-pay | Admitting: *Deleted

## 2015-12-26 DIAGNOSIS — Z3201 Encounter for pregnancy test, result positive: Secondary | ICD-10-CM

## 2015-12-26 LAB — POCT PREGNANCY, URINE: Preg Test, Ur: POSITIVE — AB

## 2015-12-26 NOTE — Progress Notes (Signed)
Positive pregnancy test.  Verification letter printed and given to patient.  Medications reviewed.  Continue medications.  LMP 11/18/15.  EDC 08/24/16.

## 2016-01-14 ENCOUNTER — Encounter: Payer: Self-pay | Admitting: Certified Nurse Midwife

## 2016-01-28 ENCOUNTER — Encounter: Payer: Self-pay | Admitting: Advanced Practice Midwife

## 2016-02-04 ENCOUNTER — Encounter: Payer: Self-pay | Admitting: Student

## 2016-02-17 NOTE — L&D Delivery Note (Signed)
Delivery Note  Labor complicated by Triple I treated w/ amp/gent, as well as variable decels, treated with amnioinfusion.   At 5:39 AM a viable female was delivered via Vaginal, Spontaneous Delivery (Presentation: oa  ).  APGAR: 8, 9; weight pending .   Placenta status: intact .  Cord: 3v  with the following complications: none .  Cord pH: not obtained  Anesthesia:  epidural Episiotomy: None Lacerations: 1st degree;Perineal Suture Repair: 2.0 vicryl Est. Blood Loss (mL):  50  Mom to postpartum.  Baby to Couplet care / Skin to Skin.  Virginia [name] 08/16/2016, 6:03 AM

## 2016-02-26 ENCOUNTER — Other Ambulatory Visit (HOSPITAL_COMMUNITY)
Admission: RE | Admit: 2016-02-26 | Discharge: 2016-02-26 | Disposition: A | Discharge status: Home / Self Care | Payer: Medicaid Other | Source: Ambulatory Visit | Attending: Certified Nurse Midwife | Admitting: Certified Nurse Midwife

## 2016-02-26 ENCOUNTER — Ambulatory Visit (INDEPENDENT_AMBULATORY_CARE_PROVIDER_SITE_OTHER): Payer: Medicaid Other | Admitting: Certified Nurse Midwife

## 2016-02-26 ENCOUNTER — Encounter: Payer: Self-pay | Admitting: Certified Nurse Midwife

## 2016-02-26 VITALS — BP 104/75 | HR 77 | Temp 97.7°F | Wt 184.6 lb

## 2016-02-26 DIAGNOSIS — Z3490 Encounter for supervision of normal pregnancy, unspecified, unspecified trimester: Secondary | ICD-10-CM

## 2016-02-26 DIAGNOSIS — Z01419 Encounter for gynecological examination (general) (routine) without abnormal findings: Secondary | ICD-10-CM | POA: Diagnosis present

## 2016-02-26 DIAGNOSIS — O0932 Supervision of pregnancy with insufficient antenatal care, second trimester: Secondary | ICD-10-CM

## 2016-02-26 DIAGNOSIS — O0990 Supervision of high risk pregnancy, unspecified, unspecified trimester: Secondary | ICD-10-CM | POA: Insufficient documentation

## 2016-02-26 DIAGNOSIS — O09522 Supervision of elderly multigravida, second trimester: Secondary | ICD-10-CM

## 2016-02-26 DIAGNOSIS — O09529 Supervision of elderly multigravida, unspecified trimester: Secondary | ICD-10-CM | POA: Insufficient documentation

## 2016-02-26 MED ORDER — PRENATE PIXIE 10-0.6-0.4-200 MG PO CAPS: 1.0000 | ORAL_CAPSULE | Freq: Every day | ORAL | 12 refills | Status: DC

## 2016-02-26 MED ORDER — ONDANSETRON HCL 8 MG PO TABS: 8.0000 mg | ORAL_TABLET | Freq: Three times a day (TID) | ORAL | 2 refills | Status: DC | PRN

## 2016-02-26 NOTE — Progress Notes (Signed)
Subjective:    Virginia [name] is being seen today for her first obstetrical visit.  This is a planned pregnancy. She is at [redacted]w[redacted]d gestation. Her obstetrical history is significant for advanced maternal age. Relationship with FOB: spouse, living together. Patient does intend to breast feed. Pregnancy history fully reviewed.  The information documented in the HPI was reviewed and verified.  Menstrual History: OB History    Gravida Para Term Preterm AB Living   4 2 2  0 1 2   SAB TAB Ectopic Multiple Live Births   1 0 0 0 2       Patient's last menstrual period was 11/18/2015 (approximate).    Past Medical History:  Diagnosis Date  . ZOXWRUEA(540.9)     Past Surgical History:  Procedure Laterality Date  . NO PAST SURGERIES       (Not in a hospital admission) No Known Allergies  Social History  Substance Use Topics  . Smoking status: Never Smoker  . Smokeless tobacco: Never Used  . Alcohol use No    History reviewed. No pertinent family history.   Review of Systems Constitutional: negative for weight loss Gastrointestinal: + for vomiting Genitourinary:negative for genital lesions and vaginal discharge and dysuria, + spotting Musculoskeletal:negative for back pain Behavioral/Psych: negative for abusive relationship, depression, illegal drug usage and tobacco use    Objective:    BP 104/75   Pulse 77   Temp 97.7 F (36.5 C)   Wt 184 lb 9.6 oz (83.7 kg)   LMP 11/18/2015 (Approximate)   BMI 29.80 kg/m  General Appearance:    Alert, cooperative, no distress, appears stated age  Head:    Normocephalic, without obvious abnormality, atraumatic  Eyes:    PERRL, conjunctiva/corneas clear, EOM's intact, fundi    benign, both eyes  Ears:    Normal TM's and external ear canals, both ears  Nose:   Nares normal, septum midline, mucosa normal, no drainage    or sinus tenderness  Throat:   Lips, mucosa, and tongue normal; teeth and gums normal  Neck:   Supple,  symmetrical, trachea midline, no adenopathy;    thyroid:  no enlargement/tenderness/nodules; no carotid   bruit or JVD  Back:     Symmetric, no curvature, ROM normal, no CVA tenderness  Lungs:     Clear to auscultation bilaterally, respirations unlabored  Chest Wall:    No tenderness or deformity   Heart:    Regular rate and rhythm, S1 and S2 normal, no murmur, rub   or gallop  Breast Exam:    No tenderness, masses, or nipple abnormality  Abdomen:     Soft, non-tender, bowel sounds active all four quadrants,    no masses, no organomegaly  Genitalia:    Normal female without lesion, discharge or tenderness  Extremities:   Extremities normal, atraumatic, no cyanosis or edema  Pulses:   2+ and symmetric all extremities  Skin:   Skin color, texture, turgor normal, no rashes or lesions  Lymph nodes:   Cervical, supraclavicular, and axillary nodes normal  Neurologic:   CNII-XII intact, normal strength, sensation and reflexes    throughout     Cervix: long, thick, closed and posterior.  FHR:148   By doppler.  FH: less than U    Lab Review Urine pregnancy test Labs reviewed yes Radiologic studies reviewed no Assessment:    Pregnancy at [redacted]w[redacted]d weeks   Unsure of LMP  H/O miscarriage  Spotting in pregnancy   N&V in early pregnancy  Plan:      Prenatal vitamins.  Counseling provided regarding continued use of seat belts, cessation of alcohol consumption, smoking or use of illicit drugs; infection precautions i.e., influenza/TDAP immunizations, toxoplasmosis,CMV, parvovirus, listeria and varicella; workplace safety, exercise during pregnancy; routine dental care, safe medications, sexual activity, hot tubs, saunas, pools, travel, caffeine use, fish and methlymercury, potential toxins, hair treatments, varicose veins Weight gain recommendations per IOM guidelines reviewed: underweight/BMI< 18.5--> gain 28 - 40 lbs; normal weight/BMI 18.5 - 24.9--> gain 25 - 35 lbs; overweight/BMI 25 - 29.9-->  gain 15 - 25 lbs; obese/BMI >30->gain  11 - 20 lbs Problem list reviewed and updated. FIRST/CF mutation testing/NIPT/QUAD SCREEN/fragile X/Ashkenazi Jewish population testing/Spinal muscular atrophy discussed: ordered. Role of ultrasound in pregnancy discussed; fetal survey: requested. Amniocentesis discussed: not indicated. VBAC calculator score: VBAC consent form provided Meds ordered this encounter  Medications  . Prenat-FeAsp-Meth-FA-DHA w/o A (PRENATE PIXIE) 10-0.6-0.4-200 MG CAPS    Sig: Take 1 tablet by mouth daily.    Dispense:  30 capsule    Refill:  12    Please process coupon: Rx BIN: [id number], RxPCN: [id number], RxGRP: [id number]: [id number], Rx: [id number]: [id number]  SUF: 01  . ondansetron (ZOFRAN) 8 MG tablet    Sig: Take 1 tablet (8 mg total) by mouth every 8 (eight) hours as needed for nausea or vomiting.    Dispense:  40 tablet    Refill:  2   Orders Placed This Encounter  Procedures  . Culture, OB Urine  . US MFM OB DETAIL +14 WK    Standing Status:   Future    Standing Expiration Date:   04/25/2017    Order Specific Question:   Reason for Exam (SYMPTOM  OR DIAGNOSIS REQUIRED)    Answer:   dating, fetal anatomy scan, spotting in pregnancy    Order Specific Question:   Preferred imaging location?    Answer:   [facility]Ul[facility]ound  . TSH  . Hemoglobinopathy evaluation  . Varicella zoster antibody, IgG  . ToxASSURE Select 13 (MW), Urine  . Hemoglobin A1c  . Obstetric Panel, Including HIV  . Cystic Fibrosis Mutation 97  . MaterniT Genome    Order Specific Question:   Is the patient insulin dependent?    Answer:   No    Order Specific Question:   Please enter gestational age. This should be expressed as weeks AND days, i.e. 16w 6d. Enter weeks here. Enter days in next question.    Answer:   2814    Order Specific Question:   Please enter gestational age. This should be expressed as weeks AND days, i.e. 16w 6d. Enter days here. Enter weeks in previous question.    Answer:   2    Order Specific  Question:   How was gestational age calculated?    Answer:   LMP    Order Specific Question:   Please give the date of LMP OR Ultrasound OR Estimated date of delivery.    Answer:   08/24/2016    Order Specific Question:   Number of Fetuses (Type of Pregnancy):    Answer:   1    Order Specific Question:   Indications for performing the test? (please choose all that apply):    Answer:   Routine screening    Order Specific Question:   Other Indications? (Y=Yes, N=No)    Answer:   Y    Order Specific Question:   Please specify other indications, if any:    Answer:   AMA  Order Specific Question:   If this is a repeat specimen, please indicate the reason:    Answer:   Not indicated    Order Specific Question:   Please specify the patient's race: (C=White/Caucasion, B=Black, I=Native American, A=Asian, H=Hispanic, O=Other, U=Unknown)    Answer:   B    Order Specific Question:   Donor Egg - indicate if the egg was obtained from in vitro fertilization.    Answer:   N    Order Specific Question:   Age of Egg Donor.    Answer:   89    Order Specific Question:   Prior Down Syndrome/ONTD screening during current pregnancy.    Answer:   N    Order Specific Question:   Prior First Trimester Testing    Answer:   N    Order Specific Question:   Prior Second Trimester Testing    Answer:   N    Order Specific Question:   Family History of Neural Tube Defects    Answer:   N    Order Specific Question:   Prior Pregnancy with Down Syndrome    Answer:   N    Order Specific Question:   Please give the patient's weight (in pounds)    Answer:   185  . AMB referral to maternal fetal medicine    Referral Priority:   Routine    Referral Type:   Consultation    Referral Reason:   Specialty Services Required    Number of Visits Requested:   1  . AMB MFM GENETICS REFERRAL    Referral Priority:   Routine    Referral Type:   Consultation    Referral Reason:   Specialty Services Required    Number of Visits  Requested:   1    Follow up in 4 weeks. 50% of 30 min visit spent on counseling and coordination of care.

## 2016-02-27 LAB — CERVICOVAGINAL ANCILLARY ONLY
Bacterial vaginitis: NEGATIVE
CANDIDA VAGINITIS: POSITIVE
CHLAMYDIA, DNA PROBE: NEGATIVE
Neisseria Gonorrhea: NEGATIVE
TRICH (WINDOWPATH): NEGATIVE

## 2016-03-01 LAB — CULTURE, OB URINE

## 2016-03-01 LAB — URINE CULTURE, OB REFLEX

## 2016-03-02 ENCOUNTER — Other Ambulatory Visit: Payer: Self-pay | Admitting: Certified Nurse Midwife

## 2016-03-02 DIAGNOSIS — B3731 Acute candidiasis of vulva and vagina: Secondary | ICD-10-CM

## 2016-03-02 DIAGNOSIS — B373 Candidiasis of vulva and vagina: Secondary | ICD-10-CM

## 2016-03-02 LAB — CYTOLOGY - PAP
DIAGNOSIS: NEGATIVE
HPV: NOT DETECTED

## 2016-03-02 MED ORDER — TERCONAZOLE 0.8 % VA CREA: 1.0000 | TOPICAL_CREAM | Freq: Every day | VAGINAL | 0 refills | Status: DC

## 2016-03-02 MED ORDER — FLUCONAZOLE 150 MG PO TABS: 150.0000 mg | ORAL_TABLET | Freq: Once | ORAL | 0 refills | Status: AC

## 2016-03-04 LAB — TOXASSURE SELECT 13 (MW), URINE

## 2016-03-06 LAB — OBSTETRIC PANEL, INCLUDING HIV
Antibody Screen: NEGATIVE
Basophils Absolute: 0 10*3/uL (ref 0.0–0.2)
Basos: 0 %
EOS (ABSOLUTE): 0.1 10*3/uL (ref 0.0–0.4)
EOS: 1 %
HEMOGLOBIN: 12 g/dL (ref 11.1–15.9)
HEP B S AG: NEGATIVE
HIV Screen 4th Generation wRfx: NONREACTIVE
Hematocrit: 35.9 % (ref 34.0–46.6)
IMMATURE GRANULOCYTES: 0 %
Immature Grans (Abs): 0 10*3/uL (ref 0.0–0.1)
LYMPHS ABS: 2.4 10*3/uL (ref 0.7–3.1)
Lymphs: 27 %
MCH: 31.1 pg (ref 26.6–33.0)
MCHC: 33.4 g/dL (ref 31.5–35.7)
MCV: 93 fL (ref 79–97)
Monocytes Absolute: 0.6 10*3/uL (ref 0.1–0.9)
Monocytes: 7 %
NEUTROS PCT: 65 %
Neutrophils Absolute: 5.8 10*3/uL (ref 1.4–7.0)
Platelets: 254 10*3/uL (ref 150–379)
RBC: 3.86 x10E6/uL (ref 3.77–5.28)
RDW: 15.1 % (ref 12.3–15.4)
RH TYPE: POSITIVE
RPR: NONREACTIVE
RUBELLA: 12.4 {index} (ref >0.99)
WBC: 8.9 10*3/uL (ref 3.4–10.8)

## 2016-03-06 LAB — HEMOGLOBINOPATHY EVALUATION
HEMOGLOBIN F QUANTITATION: 0 % (ref 0.0–2.0)
HGB C: 0 %
HGB S: 0 %
HGB VARIANT: 0 %
Hemoglobin A2 Quantitation: 2.3 % (ref 1.8–3.2)
Hgb A: 97.7 % (ref 96.4–98.8)

## 2016-03-06 LAB — TSH: TSH: 1.9 u[IU]/mL (ref 0.450–4.500)

## 2016-03-06 LAB — CYSTIC FIBROSIS MUTATION 97: Interpretation: NOT DETECTED

## 2016-03-06 LAB — HEMOGLOBIN A1C
Est. average glucose Bld gHb Est-mCnc: 111 mg/dL
Hgb A1c MFr Bld: 5.5 % (ref 4.8–5.6)

## 2016-03-06 LAB — VARICELLA ZOSTER ANTIBODY, IGG: Varicella zoster IgG: 532 index (ref >165)

## 2016-03-09 LAB — MATERNIT GENOME: PDF: 0

## 2016-03-10 ENCOUNTER — Other Ambulatory Visit: Payer: Self-pay | Admitting: Certified Nurse Midwife

## 2016-03-10 DIAGNOSIS — O0992 Supervision of high risk pregnancy, unspecified, second trimester: Secondary | ICD-10-CM

## 2016-03-19 ENCOUNTER — Encounter (HOSPITAL_COMMUNITY): Payer: Self-pay | Admitting: Certified Nurse Midwife

## 2016-03-20 ENCOUNTER — Encounter: Payer: Self-pay | Admitting: *Deleted

## 2016-03-25 ENCOUNTER — Ambulatory Visit (HOSPITAL_COMMUNITY)
Admission: RE | Admit: 2016-03-25 | Discharge: 2016-03-25 | Disposition: A | Discharge status: Home / Self Care | Payer: Medicaid Other | Source: Ambulatory Visit | Attending: Certified Nurse Midwife | Admitting: Certified Nurse Midwife

## 2016-03-25 ENCOUNTER — Other Ambulatory Visit: Payer: Self-pay | Admitting: Certified Nurse Midwife

## 2016-03-25 ENCOUNTER — Ambulatory Visit (HOSPITAL_COMMUNITY): Admission: RE | Admit: 2016-03-25 | Payer: Medicaid Other | Source: Ambulatory Visit

## 2016-03-25 ENCOUNTER — Encounter (HOSPITAL_COMMUNITY): Payer: Self-pay

## 2016-03-25 ENCOUNTER — Ambulatory Visit (INDEPENDENT_AMBULATORY_CARE_PROVIDER_SITE_OTHER): Payer: Medicaid Other | Admitting: Certified Nurse Midwife

## 2016-03-25 VITALS — BP 102/68 | HR 87 | Wt 193.0 lb

## 2016-03-25 DIAGNOSIS — Z3A18 18 weeks gestation of pregnancy: Secondary | ICD-10-CM

## 2016-03-25 DIAGNOSIS — Z363 Encounter for antenatal screening for malformations: Secondary | ICD-10-CM | POA: Diagnosis present

## 2016-03-25 DIAGNOSIS — O09522 Supervision of elderly multigravida, second trimester: Secondary | ICD-10-CM | POA: Diagnosis present

## 2016-03-25 DIAGNOSIS — O0932 Supervision of pregnancy with insufficient antenatal care, second trimester: Secondary | ICD-10-CM

## 2016-03-25 DIAGNOSIS — Z3490 Encounter for supervision of normal pregnancy, unspecified, unspecified trimester: Secondary | ICD-10-CM

## 2016-03-25 DIAGNOSIS — O0992 Supervision of high risk pregnancy, unspecified, second trimester: Secondary | ICD-10-CM

## 2016-03-25 NOTE — Progress Notes (Signed)
[facility]

## 2016-03-25 NOTE — Progress Notes (Signed)
   PRENATAL VISIT NOTE  Subjective:  Phylliss Dzentutu KPOH-RETVIG is a 41 y.o. [id number] at 1280w2d being seen today for ongoing prenatal care.  She is currently monitored for the following issues for this high-risk pregnancy and has Supervision of high-risk pregnancy; AMA (advanced maternal age) multigravida 35+; and Late prenatal care affecting pregnancy in second trimester on her problem list.  Patient reports no complaints.  Contractions: Not present. Vag. Bleeding: None.  Movement: Present. Denies leaking of fluid.   The following portions of the patient's history were reviewed and updated as appropriate: allergies, current medications, past family history, past medical history, past social history, past surgical history and problem list. Problem list updated.  Objective:   Vitals:   03/25/16 0915  BP: 102/68  Pulse: 87  Weight: 193 lb (87.5 kg)    Fetal Status: Fetal Heart Rate (bpm): 147 Fundal Height: 20 cm Movement: Present     General:  Alert, oriented and cooperative. Patient is in no acute distress.  Skin: Skin is warm and dry. No rash noted.   Cardiovascular: Normal heart rate noted  Respiratory: Normal respiratory effort, no problems with respiration noted  Abdomen: Soft, gravid, appropriate for gestational age. Pain/Pressure: Present     Pelvic:  Cervical exam deferred        Extremities: Normal range of motion.     Mental Status: Normal mood and affect. Normal behavior. Normal judgment and thought content.   Assessment and Plan:  Pregnancy: [id number] at 7280w2d  1. Supervision of high risk pregnancy in second trimester  - AFP, Serum, Open Spina Bifida  2. Elderly multigravida in second trimester     AFP today,  US @MFM  today.  Normal Genome  3. Late prenatal care affecting pregnancy in second trimester   Preterm labor symptoms and general obstetric precautions including but not limited to vaginal bleeding, contractions, leaking of fluid and fetal movement were reviewed  in detail with the patient. Please refer to After Visit Summary for other counseling recommendations.  [facility] 4 weeks (around 04/22/2016[facility]   Roe Coombsachelle A Derian Pfost, CNM

## 2016-03-27 ENCOUNTER — Other Ambulatory Visit: Payer: Self-pay | Admitting: Certified Nurse Midwife

## 2016-03-27 DIAGNOSIS — O0992 Supervision of high risk pregnancy, unspecified, second trimester: Secondary | ICD-10-CM

## 2016-04-01 ENCOUNTER — Other Ambulatory Visit: Payer: Self-pay | Admitting: Certified Nurse Midwife

## 2016-04-01 DIAGNOSIS — O0992 Supervision of high risk pregnancy, unspecified, second trimester: Secondary | ICD-10-CM

## 2016-04-01 LAB — AFP, SERUM, OPEN SPINA BIFIDA
AFP MoM: 1
AFP Value: 41.3 ng/mL
GEST. AGE ON COLLECTION DATE: 18.2 wk
MATERNAL AGE AT EDD: 40.8 a
OSBR Risk 1 IN: 10000
TEST RESULTS AFP: NEGATIVE
Weight: 193 [lb_av]

## 2016-04-22 ENCOUNTER — Encounter: Payer: Self-pay | Admitting: *Deleted

## 2016-04-22 ENCOUNTER — Ambulatory Visit (INDEPENDENT_AMBULATORY_CARE_PROVIDER_SITE_OTHER): Payer: Medicaid Other | Admitting: Obstetrics & Gynecology

## 2016-04-22 VITALS — BP 115/70 | HR 91 | Wt 196.0 lb

## 2016-04-22 DIAGNOSIS — O0992 Supervision of high risk pregnancy, unspecified, second trimester: Secondary | ICD-10-CM

## 2016-04-22 DIAGNOSIS — O09522 Supervision of elderly multigravida, second trimester: Secondary | ICD-10-CM | POA: Diagnosis not present

## 2016-04-22 DIAGNOSIS — O0932 Supervision of pregnancy with insufficient antenatal care, second trimester: Secondary | ICD-10-CM | POA: Diagnosis not present

## 2016-04-22 MED ORDER — BUTALBITAL-APAP-CAFFEINE 50-325-40 MG PO TABS: 1.0000 | ORAL_TABLET | Freq: Four times a day (QID) | ORAL | 0 refills | Status: DC | PRN

## 2016-04-22 NOTE — Progress Notes (Signed)
Pt states that she is having some cramping in her legs, ? Vein problems--would like legs checked today. Pt states that she is having severe headaches.

## 2016-04-22 NOTE — Patient Instructions (Signed)
General Headache Without Cause A headache is pain or discomfort felt around the head or neck area. The specific cause of a headache may not be found. There are many causes and types of headaches. A few common ones are:  Tension headaches.  Migraine headaches.  Cluster headaches.  Chronic daily headaches.  Follow these instructions at home: Watch your condition for any changes. Take these steps to help with your condition: Managing pain  Take over-the-counter and prescription medicines only as told by your health care provider.  Lie down in a dark, quiet room when you have a headache.  If directed, apply ice to the head and neck area: ? Put ice in a plastic bag. ? Place a towel between your skin and the bag. ? Leave the ice on for 20 minutes, 2-3 times per day.  Use a heating pad or hot shower to apply heat to the head and neck area as told by your health care provider.  Keep lights dim if bright lights bother you or make your headaches worse. Eating and drinking  Eat meals on a regular schedule.  Limit alcohol use.  Decrease the amount of caffeine you drink, or stop drinking caffeine. General instructions  Keep all follow-up visits as told by your health care provider. This is important.  Keep a headache journal to help find out what may trigger your headaches. For example, write down: ? What you eat and drink. ? How much sleep you get. ? Any change to your diet or medicines.  Try massage or other relaxation techniques.  Limit stress.  Sit up straight, and do not tense your muscles.  Do not use tobacco products, including cigarettes, chewing tobacco, or e-cigarettes. If you need help quitting, ask your health care provider.  Exercise regularly as told by your health care provider.  Sleep on a regular schedule. Get 7-9 hours of sleep, or the amount recommended by your health care provider. Contact a health care provider if:  Your symptoms are not helped by  medicine.  You have a headache that is different from the usual headache.  You have nausea or you vomit.  You have a fever. Get help right away if:  Your headache becomes severe.  You have repeated vomiting.  You have a stiff neck.  You have a loss of vision.  You have problems with speech.  You have pain in the eye or ear.  You have muscular weakness or loss of muscle control.  You lose your balance or have trouble walking.  You feel faint or pass out.  You have confusion. This information is not intended to replace advice given to you by your health care provider. Make sure you discuss any questions you have with your health care provider. Document Released: 02/02/2005 Document Revised: 07/11/2015 Document Reviewed: 05/28/2014 Elsevier Interactive Patient Education  2017 [employer]  

## 2016-04-22 NOTE — Progress Notes (Signed)
   PRENATAL VISIT NOTE  Subjective:  Virginia [name] is a 41 y.o. [id number] at 6570w2d being seen today for ongoing prenatal care.  She is currently monitored for the following issues for this high-risk pregnancy and has Supervision of high-risk pregnancy; AMA (advanced maternal age) multigravida 35+; and Late prenatal care affecting pregnancy in second trimester on her problem list.  Patient reports headache and vulvar and right leg vericose veins.  Contractions: Not present. Vag. Bleeding: None.  Movement: Present. Denies leaking of fluid.   The following portions of the patient's history were reviewed and updated as appropriate: allergies, current medications, past family history, past medical history, past social history, past surgical history and problem list. Problem list updated.  Objective:   Vitals:   04/22/16 0930  BP: 115/70  Pulse: 91  Weight: 196 lb (88.9 kg)    Fetal Status: Fetal Heart Rate (bpm): 145   Movement: Present     General:  Alert, oriented and cooperative. Patient is in no acute distress.  Skin: Skin is warm and dry. No rash noted.   Cardiovascular: Normal heart rate noted  Respiratory: Normal respiratory effort, no problems with respiration noted  Abdomen: Soft, gravid, appropriate for gestational age. Pain/Pressure: Absent     Pelvic:  Cervical exam deferred        Extremities: Normal range of motion.     Mental Status: Normal mood and affect. Normal behavior. Normal judgment and thought content.   Assessment and Plan:  Pregnancy: [id number] at 5370w2d  1. Elderly multigravida in second trimester AMA Compression stocking recommended for varicose vs. 2. Supervision of high risk pregnancy in second trimester Migraine vs TT headache - butalbital-acetaminophen-caffeine (FIORICET, ESGIC) 50-325-40 MG tablet; Take 1-2 tablets by mouth every 6 (six) hours as needed for headache.  Dispense: 20 tablet; Refill: 0  Preterm labor symptoms and general obstetric  precautions including but not limited to vaginal bleeding, contractions, leaking of fluid and fetal movement were reviewed in detail with the patient. Please refer to After Visit Summary for other counseling recommendations.  Return in about 4 weeks (around 05/20/2016).   Adam PhenixJames G Arnold, MD

## 2016-05-05 ENCOUNTER — Telehealth: Payer: Self-pay | Admitting: *Deleted

## 2016-05-05 ENCOUNTER — Inpatient Hospital Stay (HOSPITAL_COMMUNITY)
Admission: AD | Admit: 2016-05-05 | Discharge: 2016-05-05 | Disposition: A | Discharge status: Home / Self Care | Payer: Medicaid Other | Source: Ambulatory Visit | Attending: Family Medicine | Admitting: Family Medicine

## 2016-05-05 ENCOUNTER — Encounter (HOSPITAL_COMMUNITY): Payer: Self-pay | Admitting: *Deleted

## 2016-05-05 DIAGNOSIS — Z3A24 24 weeks gestation of pregnancy: Secondary | ICD-10-CM | POA: Insufficient documentation

## 2016-05-05 DIAGNOSIS — O368121 Decreased fetal movements, second trimester, fetus 1: Secondary | ICD-10-CM | POA: Diagnosis not present

## 2016-05-05 DIAGNOSIS — O368120 Decreased fetal movements, second trimester, not applicable or unspecified: Secondary | ICD-10-CM | POA: Diagnosis present

## 2016-05-05 DIAGNOSIS — Z3689 Encounter for other specified antenatal screening: Secondary | ICD-10-CM

## 2016-05-05 NOTE — Discharge Instructions (Signed)
For vulvar swelling try vulvar support pregnancy--see attached printed sheet. Also try maternal positions to improve baby position--see [email or web address]     Second Trimester of Pregnancy The second trimester is from week 14 through week 27 (months 4 through 6). The second trimester is often a time when you feel your best. Your body has adjusted to being pregnant, and you begin to feel better physically. Usually, morning sickness has lessened or quit completely, you may have more energy, and you may have an increase in appetite. The second trimester is also a time when the fetus is growing rapidly. At the end of the sixth month, the fetus is about 9 inches long and weighs about 1 pounds. You will likely begin to feel the baby move (quickening) between 16 and 20 weeks of pregnancy. Body changes during your second trimester Your body continues to go through many changes during your second trimester. The changes vary from woman to woman.  Your weight will continue to increase. You will notice your lower abdomen bulging out.  You may begin to get stretch marks on your hips, abdomen, and breasts.  You may develop headaches that can be relieved by medicines. The medicines should be approved by your health care provider.  You may urinate more often because the fetus is pressing on your bladder.  You may develop or continue to have heartburn as a result of your pregnancy.  You may develop constipation because certain hormones are causing the muscles that push waste through your intestines to slow down.  You may develop hemorrhoids or swollen, bulging veins (varicose veins).  You may have back pain. This is caused by:  Weight gain.  Pregnancy hormones that are relaxing the joints in your pelvis.  A shift in weight and the muscles that support your balance.  Your breasts will continue to grow and they will continue to become tender.  Your gums may bleed and may be sensitive to brushing  and flossing.  Dark spots or blotches (chloasma, mask of pregnancy) may develop on your face. This will likely fade after the baby is born.  A dark line from your belly button to the pubic area (linea nigra) may appear. This will likely fade after the baby is born.  You may have changes in your hair. These can include thickening of your hair, rapid growth, and changes in texture. Some women also have hair loss during or after pregnancy, or hair that feels dry or thin. Your hair will most likely return to normal after your baby is born. What to expect at prenatal visits During a routine prenatal visit:  You will be weighed to make sure you and the fetus are growing normally.  Your blood pressure will be taken.  Your abdomen will be measured to track your baby's growth.  The fetal heartbeat will be listened to.  Any test results from the previous visit will be discussed. Your health care provider may ask you:  How you are feeling.  If you are feeling the baby move.  If you have had any abnormal symptoms, such as leaking fluid, bleeding, severe headaches, or abdominal cramping.  If you are using any tobacco products, including cigarettes, chewing tobacco, and electronic cigarettes.  If you have any questions. Other tests that may be performed during your second trimester include:  Blood tests that check for:  Low iron levels (anemia).  High blood sugar that affects pregnant women (gestational diabetes) between 22 and 28 weeks.  Rh antibodies. This is  to check for a protein on red blood cells (Rh factor).  Urine tests to check for infections, diabetes, or protein in the urine.  An ultrasound to confirm the proper growth and development of the baby.  An amniocentesis to check for possible genetic problems.  Fetal screens for spina bifida and Down syndrome.  HIV (human immunodeficiency virus) testing. Routine prenatal testing includes screening for HIV, unless you choose not  to have this test. Follow these instructions at home: Medicines   Follow your health care provider's instructions regarding medicine use. Specific medicines may be either safe or unsafe to take during pregnancy.  Take a prenatal vitamin that contains at least 600 micrograms (mcg) of folic acid.  If you develop constipation, try taking a stool softener if your health care provider approves. Eating and drinking   Eat a balanced diet that includes fresh fruits and vegetables, whole grains, good sources of protein such as meat, eggs, or tofu, and low-fat dairy. Your health care provider will help you determine the amount of weight gain that is right for you.  Avoid raw meat and uncooked cheese. These carry germs that can cause birth defects in the baby.  If you have low calcium intake from food, talk to your health care provider about whether you should take a daily calcium supplement.  Limit foods that are high in fat and processed sugars, such as fried and sweet foods.  To prevent constipation:  Drink enough fluid to keep your urine clear or pale yellow.  Eat foods that are high in fiber, such as fresh fruits and vegetables, whole grains, and beans. Activity   Exercise only as directed by your health care provider. Most women can continue their usual exercise routine during pregnancy. Try to exercise for 30 minutes at least 5 days a week. Stop exercising if you experience uterine contractions.  Avoid heavy lifting, wear low heel shoes, and practice good posture.  A sexual relationship may be continued unless your health care provider directs you otherwise. Relieving pain and discomfort   Wear a good support bra to prevent discomfort from breast tenderness.  Take warm sitz baths to soothe any pain or discomfort caused by hemorrhoids. Use hemorrhoid cream if your health care provider approves.  Rest with your legs elevated if you have leg cramps or low back pain.  If you develop  varicose veins, wear support hose. Elevate your feet for 15 minutes, 3-4 times a day. Limit salt in your diet. Prenatal Care   Write down your questions. Take them to your prenatal visits.  Keep all your prenatal visits as told by your health care provider. This is important. Safety   Wear your seat belt at all times when driving.  Make a list of emergency phone numbers, including numbers for family, friends, the hospital, and police and fire departments. General instructions   Ask your health care provider for a referral to a local prenatal education class. Begin classes no later than the beginning of month 6 of your pregnancy.  Ask for help if you have counseling or nutritional needs during pregnancy. Your health care provider can offer advice or refer you to specialists for help with various needs.  Do not use hot tubs, steam rooms, or saunas.  Do not douche or use tampons or scented sanitary pads.  Do not cross your legs for long periods of time.  Avoid cat litter boxes and soil used by cats. These carry germs that can cause birth defects in  the baby and possibly loss of the fetus by miscarriage or stillbirth.  Avoid all smoking, herbs, alcohol, and unprescribed drugs. Chemicals in these products can affect the formation and growth of the baby.  Do not use any products that contain nicotine or tobacco, such as cigarettes and e-cigarettes. If you need help quitting, ask your health care provider.  Visit your dentist if you have not gone yet during your pregnancy. Use a soft toothbrush to brush your teeth and be gentle when you floss. Contact a health care provider if:  You have dizziness.  You have mild pelvic cramps, pelvic pressure, or nagging pain in the abdominal area.  You have persistent nausea, vomiting, or diarrhea.  You have a bad smelling vaginal discharge.  You have pain when you urinate. Get help right away if:  You have a fever.  You are leaking fluid from  your vagina.  You have spotting or bleeding from your vagina.  You have severe abdominal cramping or pain.  You have rapid weight gain or weight loss.  You have shortness of breath with chest pain.  You notice sudden or extreme swelling of your face, hands, ankles, feet, or legs.  You have not felt your baby move in over an hour.  You have severe headaches that do not go away when you take medicine.  You have vision changes. Summary  The second trimester is from week 14 through week 27 (months 4 through 6). It is also a time when the fetus is growing rapidly.  Your body goes through many changes during pregnancy. The changes vary from woman to woman.  Avoid all smoking, herbs, alcohol, and unprescribed drugs. These chemicals affect the formation and growth your baby.  Do not use any tobacco products, such as cigarettes, chewing tobacco, and e-cigarettes. If you need help quitting, ask your health care provider.  Contact your health care provider if you have any questions. Keep all prenatal visits as told by your health care provider. This is important. This information is not intended to replace advice given to you by your health care provider. Make sure you discuss any questions you have with your health care provider. Document Released: 01/27/2001 Document Revised: 07/11/2015 Document Reviewed: 04/05/2012 Elsevier Interactive Patient Education  2017 [employer]

## 2016-05-05 NOTE — Progress Notes (Signed)
Baby is active when FM applied and pt aware

## 2016-05-05 NOTE — Telephone Encounter (Signed)
Patient states she felt decreased movement last night and no movement this morning. 9:40 Attempted to call patient- no answer- left message if she is feeling no movement she needs to go to [facility]U ( could not offer appointment patient did not answer phone)

## 2016-05-05 NOTE — Progress Notes (Signed)
Virginia CounterLisa Leftwich-Kirby CNM in to discuss d/c plan. Written and verbal d/c instructions given and understanding voiced.

## 2016-05-05 NOTE — MAU Note (Signed)
Urine sent to lab 

## 2016-05-05 NOTE — MAU Provider Note (Signed)
Chief Complaint:  Decreased Fetal Movement   None     HPI: Virginia [name] is a 41 y.o. [id number] at 60w1dwho presents to maternity admissions reporting decreased fetal movement x 2 days. She reports her baby usually moves well in the morning after she drinks 2 glasses of water but yesterday and today she has felt little movement all day.  She tried drinking water, eating, walking and resting but there were only a handful of movements all day which is unusual.  She reports good fetal movement since arriving in MAU.  She also reports vulvar swelling with this pregnancy that is not painful but is uncomfortable and embarrassing.  She has not tried any treatments. There are no associated symptoms. She denies LOF, vaginal bleeding, vaginal itching/burning, urinary symptoms, h/a, dizziness, n/v, or fever/chills.    HPI  Past Medical History: Past Medical History:  Diagnosis Date  . Headache(784.0)     Past obstetric history: OB History  Gravida Para Term Preterm AB Living  4 2 2  0 1 2  SAB TAB Ectopic Multiple Live Births  1 0 0 0 2    # Outcome Date GA Lbr Len/2nd Weight Sex Delivery Anes PTL Lv  4 Current           3 Term 09/10/13 [redacted]w[redacted]d 08:15 / 01:12 6 lb 4.9 oz (2.86 kg) F Vag-Spont EPI  LIV  2 Term 11/20/10 [redacted]w[redacted]d 24:54 / 01:27 7 lb 4.2 oz (3.295 kg) M Vag-Spont EPI  LIV     Birth Comments: Normal  1 SAB               Past Surgical History: Past Surgical History:  Procedure Laterality Date  . NO PAST SURGERIES      Family History: History reviewed. No pertinent family history.  Social History: Social History  Substance Use Topics  . Smoking status: Never Smoker  . Smokeless tobacco: Never Used  . Alcohol use No    Allergies: No Known Allergies  Meds:  Prescriptions Prior to Admission  Medication Sig Dispense Refill Last Dose  . butalbital-acetaminophen-caffeine (FIORICET, ESGIC) 50-325-40 MG tablet Take 1-2 tablets by mouth every 6 (six) hours as needed for  headache. 20 tablet 0 Past Week at Unknown time  . folic acid (FOLVITE) 1 MG tablet Take 1 mg by mouth daily.   Past Week at Unknown time  . Omega-3 Fatty Acids (FISH OIL PO) Take 1 capsule by mouth daily.   05/05/2016 at Unknown time  . ondansetron (ZOFRAN) 8 MG tablet Take 1 tablet (8 mg total) by mouth every 8 (eight) hours as needed for nausea or vomiting. 40 tablet 2 05/04/2016 at Unknown time  . Prenat-FeAsp-Meth-FA-DHA w/o A (PRENATE PIXIE) 10-0.6-0.4-200 MG CAPS Take 1 tablet by mouth daily. 30 capsule 12 05/04/2016 at Unknown time  . Prenatal Vit-Fe Fumarate-FA (PRENATAL MULTIVITAMIN) TABS tablet Take 1 tablet by mouth daily at 12 noon.   05/05/2016 at Unknown time  . terconazole (TERAZOL 3) 0.8 % vaginal cream Place 1 applicator vaginally at bedtime. (Patient not taking: Reported on 03/25/2016) 20 g 0 Not Taking    ROS:  Review of Systems  Constitutional: Negative for chills, fatigue and fever.  HENT: Negative for sinus pressure.   Eyes: Negative for photophobia and visual disturbance.  Respiratory: Negative for shortness of breath.   Cardiovascular: Negative for chest pain.  Gastrointestinal: Negative for abdominal pain, constipation, diarrhea, nausea and vomiting.  Genitourinary: Negative for difficulty urinating, dysuria, flank pain, frequency, pelvic pain,  vaginal bleeding, vaginal discharge and vaginal pain.  Musculoskeletal: Negative for neck pain.  Neurological: Negative for dizziness, weakness and headaches.  Psychiatric/Behavioral: Negative.      I have reviewed patient's Past Medical Hx, Surgical Hx, Family Hx, Social Hx, medications and allergies.   Physical Exam  Patient Vitals for the past 24 hrs:  BP Temp Temp src Pulse Resp  05/05/16 1429 114/66 98.2 F (36.8 C) - 70 18  05/05/16 1427 114/66 - - 70 -  05/05/16 1307 111/83 97.8 F (36.6 C) Oral (!) 121 20   Constitutional: Well-developed, well-nourished female in no acute distress.  Cardiovascular: normal  rate Respiratory: normal effort GI: Abd soft, non-tender, gravid appropriate for gestational age.  MS: Extremities nontender, no edema, normal ROM Neurologic: Alert and oriented x 4.  GU: Neg CVAT.  PELVIC EXAM: Cervix pink, visually closed, without lesion, scant white creamy discharge, vaginal walls and external genitalia normal Bimanual exam: Cervix 0/long/high, firm, anterior, neg CMT, uterus nontender, nonenlarged, adnexa without tenderness, enlargement, or mass     FHT:  Baseline 140 , moderate variability, accelerations present (15x15), no decelerations Contractions: None on toco or to palpation   Labs: No results found for this or any previous visit (from the past 24 hour(s)). O/Positive/-- (01/10 1211)  Imaging:  No results found.  MAU Course/MDM: I have ordered labs and reviewed results.  NST reviewed and reactive Discussed normal fetal movement at 24 weeks, reassurance provided, recommend pt always follow up with her office or come to MAU if fetal movements are concerning for her. Discussed vulvar swelling and recommend vulvar and abdominal support bands.  Also recommend maternal positions to improve fetal position including [email or web address] and trying simple things like hands and knees position for comfort. Pt states understanding and agrees with plan of care. Pt to f/u with [facility] as scheduled Pt stable at time of discharge.  Today's evaluation included a work-up for preterm labor which can be life-threatening for both mom and baby.  Assessment: 1. NST (non-stress test) reactive   2. Decreased fetal movement affecting management of pregnancy in second trimester, single or unspecified fetus     Plan: Discharge home Labor precautions and fetal kick counts  [facility] Information    [facility].   Why:  As scheduled, return to MAU as needed for emergencies Contact information: [address] [place]  [phone]         Allergies as of 05/05/2016   No Known Allergies     Medication List    STOP taking these medications   terconazole 0.8 % vaginal cream Commonly known as:  TERAZOL 3     TAKE these medications   butalbital-acetaminophen-caffeine 50-325-40 MG tablet Commonly known as:  FIORICET, ESGIC Take 1-2 tablets by mouth every 6 (six) hours as needed for headache.   FISH OIL PO Take 1 capsule by mouth daily.   folic acid 1 MG tablet Commonly known as:  FOLVITE Take 1 mg by mouth daily.   ondansetron 8 MG tablet Commonly known as:  ZOFRAN Take 1 tablet (8 mg total) by mouth every 8 (eight) hours as needed for nausea or vomiting.   prenatal multivitamin Tabs tablet Take 1 tablet by mouth daily at 12 noon.   PRENATE PIXIE 10-0.6-0.4-200 MG Caps Take 1 tablet by mouth daily.       Sharen CounterLisa Leftwich-Kirby Certified Nurse-Midwife 05/05/2016 2:37 PM

## 2016-05-05 NOTE — MAU Note (Signed)
Pt has had decreased FM since yesterday, states the shape of her abdomen has changed, is smaller.  Pt drinks 2 glasses as soon as she gets out of bed & baby usually moves a lot.  Has been very decreased.  Denies contractions, bleeding or LOF.

## 2016-05-20 ENCOUNTER — Ambulatory Visit (INDEPENDENT_AMBULATORY_CARE_PROVIDER_SITE_OTHER): Payer: Medicaid Other | Admitting: Certified Nurse Midwife

## 2016-05-20 ENCOUNTER — Encounter (HOSPITAL_COMMUNITY): Payer: Self-pay

## 2016-05-20 ENCOUNTER — Ambulatory Visit (HOSPITAL_COMMUNITY)
Admission: RE | Admit: 2016-05-20 | Discharge: 2016-05-20 | Disposition: A | Discharge status: Home / Self Care | Payer: Medicaid Other | Source: Ambulatory Visit | Attending: Certified Nurse Midwife | Admitting: Certified Nurse Midwife

## 2016-05-20 VITALS — BP 110/56 | HR 83 | Wt 205.6 lb

## 2016-05-20 DIAGNOSIS — Z3A26 26 weeks gestation of pregnancy: Secondary | ICD-10-CM | POA: Insufficient documentation

## 2016-05-20 DIAGNOSIS — O0992 Supervision of high risk pregnancy, unspecified, second trimester: Secondary | ICD-10-CM

## 2016-05-20 DIAGNOSIS — Z362 Encounter for other antenatal screening follow-up: Secondary | ICD-10-CM | POA: Insufficient documentation

## 2016-05-20 DIAGNOSIS — O09523 Supervision of elderly multigravida, third trimester: Secondary | ICD-10-CM

## 2016-05-20 DIAGNOSIS — O0932 Supervision of pregnancy with insufficient antenatal care, second trimester: Secondary | ICD-10-CM | POA: Diagnosis not present

## 2016-05-20 DIAGNOSIS — O09522 Supervision of elderly multigravida, second trimester: Secondary | ICD-10-CM

## 2016-05-20 NOTE — Progress Notes (Signed)
Patient reports good fetal movement, reports occassional pelvic pressure, denies contractions and bleeding.

## 2016-05-20 NOTE — Progress Notes (Signed)
   PRENATAL VISIT NOTE  Subjective:  Virginia [name] is a 41 y.o. [id number] at [redacted]w[redacted]d being seen today for ongoing prenatal care.  She is currently monitored for the following issues for this high-risk pregnancy and has Supervision of high-risk pregnancy; AMA (advanced maternal age) multigravida 35+; and Late prenatal care affecting pregnancy in second trimester on her problem list.  Patient reports no complaints.  Contractions: Not present. Vag. Bleeding: None.  Movement: Present. Denies leaking of fluid.   The following portions of the patient's history were reviewed and updated as appropriate: allergies, current medications, past family history, past medical history, past social history, past surgical history and problem list. Problem list updated.  Objective:   Vitals:   05/20/16 0947  BP: 101/67  Pulse: 99  Temp: 98.8 F (37.1 C)  Weight: 204 lb (92.5 kg)    Fetal Status: Fetal Heart Rate (bpm): 141 Fundal Height: 27 cm Movement: Present     General:  Alert, oriented and cooperative. Patient is in no acute distress.  Skin: Skin is warm and dry. No rash noted.   Cardiovascular: Normal heart rate noted  Respiratory: Normal respiratory effort, no problems with respiration noted  Abdomen: Soft, gravid, appropriate for gestational age. Pain/Pressure: Present     Pelvic:  Cervical exam deferred        Extremities: Normal range of motion.  Edema: Trace  Mental Status: Normal mood and affect. Normal behavior. Normal judgment and thought content.   Assessment and Plan:  Pregnancy: [id number] at [redacted]w[redacted]d  1. Supervision of high risk pregnancy in second trimester  Supervision of high risk pregnancy in second trimester Has [place] scheduled for this AM.    Preterm labor symptoms and general obstetric precautions including but not limited to vaginal bleeding, contractions, leaking of fluid and fetal movement were reviewed in detail with the patient. Please refer to After Visit Summary for  other counseling recommendations.  Return in about 2 weeks (around 06/03/2016) for ROB, 2 hr OGTT.   Roe Coombs, CNM

## 2016-06-03 ENCOUNTER — Ambulatory Visit (INDEPENDENT_AMBULATORY_CARE_PROVIDER_SITE_OTHER): Payer: Medicaid Other | Admitting: Certified Nurse Midwife

## 2016-06-03 ENCOUNTER — Encounter: Payer: Self-pay | Admitting: Certified Nurse Midwife

## 2016-06-03 ENCOUNTER — Other Ambulatory Visit: Payer: Medicaid Other

## 2016-06-03 VITALS — BP 109/67 | HR 54 | Wt 205.0 lb

## 2016-06-03 DIAGNOSIS — O0993 Supervision of high risk pregnancy, unspecified, third trimester: Secondary | ICD-10-CM | POA: Diagnosis not present

## 2016-06-03 DIAGNOSIS — O0933 Supervision of pregnancy with insufficient antenatal care, third trimester: Secondary | ICD-10-CM | POA: Diagnosis not present

## 2016-06-03 DIAGNOSIS — O0932 Supervision of pregnancy with insufficient antenatal care, second trimester: Secondary | ICD-10-CM

## 2016-06-03 DIAGNOSIS — O09523 Supervision of elderly multigravida, third trimester: Secondary | ICD-10-CM

## 2016-06-03 NOTE — Progress Notes (Signed)
   PRENATAL VISIT NOTE  Subjective:  Virginia [name] is a 41 y.o. [id number] at [redacted]w[redacted]d being seen today for ongoing prenatal care.  She is currently monitored for the following issues for this high-risk pregnancy and has Supervision of high-risk pregnancy; AMA (advanced maternal age) multigravida 35+; and Late prenatal care affecting pregnancy in second trimester on her problem list.  Patient reports no bleeding, no contractions, no cramping, no leaking and dental letter provided for treatment.  Contractions: Not present. Vag. Bleeding: None.  Movement: Present. Denies leaking of fluid.   The following portions of the patient's history were reviewed and updated as appropriate: allergies, current medications, past family history, past medical history, past social history, past surgical history and problem list. Problem list updated.  Objective:   Vitals:   06/03/16 0837  BP: 109/67  Pulse: (!) 54  Weight: 205 lb (93 kg)    Fetal Status:     Movement: Present     General:  Alert, oriented and cooperative. Patient is in no acute distress.  Skin: Skin is warm and dry. No rash noted.   Cardiovascular: Normal heart rate noted  Respiratory: Normal respiratory effort, no problems with respiration noted  Abdomen: Soft, gravid, appropriate for gestational age. Pain/Pressure: Present     Pelvic:  Cervical exam deferred        Extremities: Normal range of motion.  Edema: None  Mental Status: Normal mood and affect. Normal behavior. Normal judgment and thought content.   Assessment and Plan:  Pregnancy: [id number] at [redacted]w[redacted]d  1. Elderly multigravida in third trimester     Antenatal testing  weeks, has [place] scheduled for 07/01/16, last scan 05/20/16: EFW 56%.    2. Supervision of high risk pregnancy in third trimester      Doing well - Glucose Tolerance, 2 Hours w/1 Hour - CBC - HIV antibody (with reflex) - RPR  3. Late prenatal care affecting pregnancy in second trimester     Started  care  wks.  Preterm labor symptoms and general obstetric precautions including but not limited to vaginal bleeding, contractions, leaking of fluid and fetal movement were reviewed in detail with the patient. Please refer to After Visit Summary for other counseling recommendations.  Return in about 2 weeks (around 06/17/2016) for ROB, HOB.   Roe Coombs, CNM

## 2016-06-04 ENCOUNTER — Other Ambulatory Visit: Payer: Self-pay | Admitting: Certified Nurse Midwife

## 2016-06-04 DIAGNOSIS — O0993 Supervision of high risk pregnancy, unspecified, third trimester: Secondary | ICD-10-CM

## 2016-06-04 LAB — CBC
Hematocrit: 34.7 % (ref 34.0–46.6)
Hemoglobin: 11.4 g/dL (ref 11.1–15.9)
MCH: 31.1 pg (ref 26.6–33.0)
MCHC: 32.9 g/dL (ref 31.5–35.7)
MCV: 95 fL (ref 79–97)
PLATELETS: 203 10*3/uL (ref 150–379)
RBC: 3.66 x10E6/uL — ABNORMAL LOW (ref 3.77–5.28)
RDW: 15 % (ref 12.3–15.4)
WBC: 11.7 10*3/uL — AB (ref 3.4–10.8)

## 2016-06-04 LAB — GLUCOSE TOLERANCE, 2 HOURS W/ 1HR
GLUCOSE, FASTING: 69 mg/dL (ref 65–91)
Glucose, 1 hour: 100 mg/dL (ref 65–179)
Glucose, 2 hour: 94 mg/dL (ref 65–152)

## 2016-06-04 LAB — HIV ANTIBODY (ROUTINE TESTING W REFLEX): HIV Screen 4th Generation wRfx: NONREACTIVE

## 2016-06-04 LAB — RPR: RPR: NONREACTIVE

## 2016-06-10 ENCOUNTER — Encounter: Payer: Self-pay | Admitting: *Deleted

## 2016-06-17 ENCOUNTER — Ambulatory Visit (INDEPENDENT_AMBULATORY_CARE_PROVIDER_SITE_OTHER): Payer: Medicaid Other | Admitting: Certified Nurse Midwife

## 2016-06-17 VITALS — BP 121/75 | HR 88 | Wt 208.0 lb

## 2016-06-17 DIAGNOSIS — O09523 Supervision of elderly multigravida, third trimester: Secondary | ICD-10-CM

## 2016-06-17 DIAGNOSIS — O0993 Supervision of high risk pregnancy, unspecified, third trimester: Secondary | ICD-10-CM

## 2016-06-17 NOTE — Progress Notes (Signed)
   PRENATAL VISIT NOTE  Subjective:  Virginia [name] is a 41 y.o. [id number] at [redacted]w[redacted]d being seen today for ongoing prenatal care.  She is currently monitored for the following issues for this high-risk pregnancy and has Supervision of high-risk pregnancy; AMA (advanced maternal age) multigravida 35+; and Late prenatal care affecting pregnancy in second trimester on her problem list.  Patient reports no complaints.  Contractions: Not present. Vag. Bleeding: None.  Movement: Present. Denies leaking of fluid.   The following portions of the patient's history were reviewed and updated as appropriate: allergies, current medications, past family history, past medical history, past social history, past surgical history and problem list. Problem list updated.  Objective:   Vitals:   06/17/16 1034  BP: 121/75  Pulse: 88  Weight: 208 lb (94.3 kg)    Fetal Status: Fetal Heart Rate (bpm): 145 Fundal Height: 30 cm Movement: Present     General:  Alert, oriented and cooperative. Patient is in no acute distress.  Skin: Skin is warm and dry. No rash noted.   Cardiovascular: Normal heart rate noted  Respiratory: Normal respiratory effort, no problems with respiration noted  Abdomen: Soft, gravid, appropriate for gestational age. Pain/Pressure: Present     Pelvic:  Cervical exam deferred        Extremities: Normal range of motion.     Mental Status: Normal mood and affect. Normal behavior. Normal judgment and thought content.   Assessment and Plan:  Pregnancy: [id number] at [redacted]w[redacted]d  1. Supervision of high risk pregnancy in third trimester      Doing well.    2. Elderly multigravida in third trimester     Start antenatal testing  weeks.  Has [place] scheduled for 07/01/16  Preterm labor symptoms and general obstetric precautions including but not limited to vaginal bleeding, contractions, leaking of fluid and fetal movement were reviewed in detail with the patient. Please refer to After Visit  Summary for other counseling recommendations.  Return in about 2 weeks (around 07/01/2016) for [facility].   Roe Coombs, CNM

## 2016-07-01 ENCOUNTER — Encounter (HOSPITAL_COMMUNITY): Payer: Self-pay

## 2016-07-01 ENCOUNTER — Ambulatory Visit (HOSPITAL_COMMUNITY)
Admission: RE | Admit: 2016-07-01 | Discharge: 2016-07-01 | Disposition: A | Discharge status: Home / Self Care | Payer: Medicaid Other | Source: Ambulatory Visit | Attending: Certified Nurse Midwife | Admitting: Certified Nurse Midwife

## 2016-07-01 ENCOUNTER — Encounter: Payer: Self-pay | Admitting: Certified Nurse Midwife

## 2016-07-01 ENCOUNTER — Ambulatory Visit (INDEPENDENT_AMBULATORY_CARE_PROVIDER_SITE_OTHER): Payer: Medicaid Other | Admitting: Certified Nurse Midwife

## 2016-07-01 VITALS — BP 114/76 | HR 92 | Wt 211.4 lb

## 2016-07-01 DIAGNOSIS — O0932 Supervision of pregnancy with insufficient antenatal care, second trimester: Secondary | ICD-10-CM

## 2016-07-01 DIAGNOSIS — Z3A32 32 weeks gestation of pregnancy: Secondary | ICD-10-CM | POA: Insufficient documentation

## 2016-07-01 DIAGNOSIS — O09523 Supervision of elderly multigravida, third trimester: Secondary | ICD-10-CM | POA: Diagnosis not present

## 2016-07-01 DIAGNOSIS — O0993 Supervision of high risk pregnancy, unspecified, third trimester: Secondary | ICD-10-CM | POA: Diagnosis not present

## 2016-07-01 DIAGNOSIS — O0933 Supervision of pregnancy with insufficient antenatal care, third trimester: Secondary | ICD-10-CM | POA: Diagnosis not present

## 2016-07-01 NOTE — Progress Notes (Signed)
   PRENATAL VISIT NOTE  Subjective:  Virginia [name] is a 41 y.o. [id number] at 2624w2d being seen today for ongoing prenatal care.  She is currently monitored for the following issues for this high-risk pregnancy and has Supervision of high-risk pregnancy; AMA (advanced maternal age) multigravida 35+; and Late prenatal care affecting pregnancy in second trimester on her problem list.  Patient reports no complaints.  Contractions: Not present. Vag. Bleeding: None.  Movement: Present. Denies leaking of fluid.   The following portions of the patient's history were reviewed and updated as appropriate: allergies, current medications, past family history, past medical history, past social history, past surgical history and problem list. Problem list updated.  Objective:   Vitals:   07/01/16 1319  BP: 114/76  Pulse: 92  Weight: 211 lb 6.4 oz (95.9 kg)    Fetal Status: Fetal Heart Rate (bpm): 130 Fundal Height: 32 cm Movement: Present     General:  Alert, oriented and cooperative. Patient is in no acute distress.  Skin: Skin is warm and dry. No rash noted.   Cardiovascular: Normal heart rate noted  Respiratory: Normal respiratory effort, no problems with respiration noted  Abdomen: Soft, gravid, appropriate for gestational age. Pain/Pressure: Present     Pelvic:  Cervical exam deferred        Extremities: Normal range of motion.  Edema: None  Mental Status: Normal mood and affect. Normal behavior. Normal judgment and thought content.   Assessment and Plan:  Pregnancy: [id number] at 3324w2d  1. Supervision of high risk pregnancy in third trimester      Doing well.  Normal US today at MFM reviewed.  2. Elderly multigravida in third trimester      Start antenatal testing @36  weeks  3. Late prenatal care affecting pregnancy in second trimester     @14  weeks  Preterm labor symptoms and general obstetric precautions including but not limited to vaginal bleeding, contractions, leaking of  fluid and fetal movement were reviewed in detail with the patient. Please refer to After Visit Summary for other counseling recommendations.  [facility] about 2 weeks (around 07/15/2016) [facility] schedule antenatal testing for 36 wks.   Roe Coombsenney, Jermar Colter A, CNM

## 2016-07-17 ENCOUNTER — Ambulatory Visit (INDEPENDENT_AMBULATORY_CARE_PROVIDER_SITE_OTHER): Payer: Medicaid Other | Admitting: Certified Nurse Midwife

## 2016-07-17 ENCOUNTER — Encounter: Payer: Self-pay | Admitting: Certified Nurse Midwife

## 2016-07-17 VITALS — BP 119/81 | HR 80 | Wt 213.4 lb

## 2016-07-17 DIAGNOSIS — O0993 Supervision of high risk pregnancy, unspecified, third trimester: Secondary | ICD-10-CM | POA: Diagnosis not present

## 2016-07-17 DIAGNOSIS — O09523 Supervision of elderly multigravida, third trimester: Secondary | ICD-10-CM | POA: Diagnosis not present

## 2016-07-17 NOTE — Progress Notes (Signed)
   PRENATAL VISIT NOTE  Subjective:  Virginia [name] is a 41 y.o. [id number] at 6551w4d being seen today for ongoing prenatal care.  She is currently monitored for the following issues for this high-risk pregnancy and has Supervision of high-risk pregnancy; AMA (advanced maternal age) multigravida 35+; and Late prenatal care affecting pregnancy in second trimester on her problem list.  Patient reports no complaints.  Contractions: Irregular. Vag. Bleeding: None.  Movement: Present. Denies leaking of fluid.   The following portions of the patient's history were reviewed and updated as appropriate: allergies, current medications, past family history, past medical history, past social history, past surgical history and problem list. Problem list updated.  Objective:   Vitals:   07/17/16 1104  BP: 119/81  Pulse: 80  Weight: 213 lb 6.4 oz (96.8 kg)    Fetal Status: Fetal Heart Rate (bpm): 132 Fundal Height: 35 cm Movement: Present     General:  Alert, oriented and cooperative. Patient is in no acute distress.  Skin: Skin is warm and dry. No rash noted.   Cardiovascular: Normal heart rate noted  Respiratory: Normal respiratory effort, no problems with respiration noted  Abdomen: Soft, gravid, appropriate for gestational age. Pain/Pressure: Present     Pelvic:  Cervical exam deferred        Extremities: Normal range of motion.  Edema: Trace  Mental Status: Normal mood and affect. Normal behavior. Normal judgment and thought content.   Assessment and Plan:  Pregnancy: [id number] at 5451w4d  1. Supervision of high risk pregnancy in third trimester     Doing well.   2. Elderly multigravida in third trimester     Antenatal testing @36  weeks.   Preterm labor symptoms and general obstetric precautions including but not limited to vaginal bleeding, contractions, leaking of fluid and fetal movement were reviewed in detail with the patient. Please refer to After Visit Summary for other  counseling recommendations.  [facility] (around 07[facility]24/2016[facility]   Roe Coombsachelle A Jorge Retz, CNM

## 2016-07-27 ENCOUNTER — Ambulatory Visit (INDEPENDENT_AMBULATORY_CARE_PROVIDER_SITE_OTHER): Payer: Medicaid Other | Admitting: Certified Nurse Midwife

## 2016-07-27 ENCOUNTER — Other Ambulatory Visit (HOSPITAL_COMMUNITY)
Admission: RE | Admit: 2016-07-27 | Discharge: 2016-07-27 | Disposition: A | Discharge status: Home / Self Care | Payer: Medicaid Other | Source: Ambulatory Visit | Attending: Certified Nurse Midwife | Admitting: Certified Nurse Midwife

## 2016-07-27 VITALS — BP 122/78 | HR 88 | Wt 217.3 lb

## 2016-07-27 DIAGNOSIS — O0993 Supervision of high risk pregnancy, unspecified, third trimester: Secondary | ICD-10-CM | POA: Diagnosis not present

## 2016-07-27 DIAGNOSIS — O0933 Supervision of pregnancy with insufficient antenatal care, third trimester: Secondary | ICD-10-CM

## 2016-07-27 DIAGNOSIS — O09523 Supervision of elderly multigravida, third trimester: Secondary | ICD-10-CM

## 2016-07-27 DIAGNOSIS — O0932 Supervision of pregnancy with insufficient antenatal care, second trimester: Secondary | ICD-10-CM

## 2016-07-27 DIAGNOSIS — Z3A36 36 weeks gestation of pregnancy: Secondary | ICD-10-CM | POA: Insufficient documentation

## 2016-07-27 LAB — OB RESULTS CONSOLE GBS: STREP GROUP B AG: NEGATIVE

## 2016-07-27 NOTE — Addendum Note (Signed)
Addended by: Hamilton CapriBURCH, Jashad Depaula J on: 07/27/2016 05:02 PM   Modules accepted: Orders

## 2016-07-27 NOTE — Progress Notes (Signed)
   PRENATAL VISIT NOTE  Subjective:  Virginia [name] is a 41 y.o. [id number] at 7994w0d being seen today for ongoing prenatal care.  She is currently monitored for the following issues for this high-risk pregnancy and has Supervision of high-risk pregnancy; AMA (advanced maternal age) multigravida 35+; and Late prenatal care affecting pregnancy in second trimester on her problem list.  Patient reports no bleeding, no leaking and occasional contractions.  Contractions: Irregular. Vag. Bleeding: None.  Movement: Present. Denies leaking of fluid.   The following portions of the patient's history were reviewed and updated as appropriate: allergies, current medications, past family history, past medical history, past social history, past surgical history and problem list. Problem list updated.  Objective:   Vitals:   07/27/16 1405  BP: 122/78  Pulse: 88  Weight: 217 lb 4.8 oz (98.6 kg)    Fetal Status: Fetal Heart Rate (bpm): NST Fundal Height: 35 cm Movement: Present     General:  Alert, oriented and cooperative. Patient is in no acute distress.  Skin: Skin is warm and dry. No rash noted.   Cardiovascular: Normal heart rate noted  Respiratory: Normal respiratory effort, no problems with respiration noted  Abdomen: Soft, gravid, appropriate for gestational age. Pain/Pressure: Present     Pelvic:  Cervical exam performed Dilation: 1 Effacement (%): 0 Station: Ballotable  Extremities: Normal range of motion.  Edema: Trace  Mental Status: Normal mood and affect. Normal behavior. Normal judgment and thought content.   NST: + accels, no decels, moderate variability, Cat. 1 tracing. No contractions on toco.   Assessment and Plan:  Pregnancy: [id number] at 2594w0d  1. Elderly multigravida in third trimester     Doing well. Reactive NST  2. Supervision of high risk pregnancy in third trimester        3. Late prenatal care affecting pregnancy in second trimester     @14  weeks  Preterm  labor symptoms and general obstetric precautions including but not limited to vaginal bleeding, contractions, leaking of fluid and fetal movement were reviewed in detail with the patient. Please refer to After Visit Summary for other counseling recommendations.  [facility] about [facility] (around 08[facility]03/2016[facility].   Roe Coombsachelle A Bakary Bramer, CNM

## 2016-07-28 LAB — CERVICOVAGINAL ANCILLARY ONLY
Bacterial vaginitis: NEGATIVE
Candida vaginitis: NEGATIVE
Chlamydia: NEGATIVE
Neisseria Gonorrhea: NEGATIVE
Trichomonas: NEGATIVE

## 2016-07-29 ENCOUNTER — Other Ambulatory Visit: Payer: Self-pay | Admitting: Certified Nurse Midwife

## 2016-07-29 DIAGNOSIS — O0993 Supervision of high risk pregnancy, unspecified, third trimester: Secondary | ICD-10-CM

## 2016-07-29 LAB — STREP GP B NAA: STREP GROUP B AG: NEGATIVE

## 2016-08-03 ENCOUNTER — Encounter: Payer: Medicaid Other | Admitting: Certified Nurse Midwife

## 2016-08-04 ENCOUNTER — Encounter: Payer: Medicaid Other | Admitting: Certified Nurse Midwife

## 2016-08-06 ENCOUNTER — Other Ambulatory Visit: Payer: Medicaid Other

## 2016-08-06 ENCOUNTER — Ambulatory Visit (INDEPENDENT_AMBULATORY_CARE_PROVIDER_SITE_OTHER): Payer: Medicaid Other

## 2016-08-06 VITALS — BP 122/75 | HR 81 | Wt 216.0 lb

## 2016-08-06 DIAGNOSIS — O09523 Supervision of elderly multigravida, third trimester: Secondary | ICD-10-CM

## 2016-08-06 DIAGNOSIS — O0993 Supervision of high risk pregnancy, unspecified, third trimester: Secondary | ICD-10-CM

## 2016-08-06 NOTE — Progress Notes (Signed)
Nurse visit for NST only. Pt did not have ROB this wk d/t scheduling error. No available visits today; pt scheduled for ROB today Reactive NST per Debroah LoopArnold. Next appt 08/10/16.

## 2016-08-07 ENCOUNTER — Other Ambulatory Visit: Payer: Medicaid Other

## 2016-08-07 ENCOUNTER — Ambulatory Visit (INDEPENDENT_AMBULATORY_CARE_PROVIDER_SITE_OTHER): Payer: Medicaid Other | Admitting: Certified Nurse Midwife

## 2016-08-07 VITALS — BP 128/85 | HR 90 | Wt 214.2 lb

## 2016-08-07 DIAGNOSIS — O09523 Supervision of elderly multigravida, third trimester: Secondary | ICD-10-CM

## 2016-08-07 DIAGNOSIS — O0993 Supervision of high risk pregnancy, unspecified, third trimester: Secondary | ICD-10-CM

## 2016-08-07 NOTE — Progress Notes (Signed)
Patient reports an increase in discharge- but nothing that she considers abnormal. Patient is ready to have her baby.

## 2016-08-07 NOTE — Progress Notes (Signed)
   PRENATAL VISIT NOTE  Subjective:  Virginia [name] is a 41 y.o. [id number] at 3858w4d being seen today for ongoing prenatal care.  She is currently monitored for the following issues for this high-risk pregnancy and has Supervision of high-risk pregnancy; AMA (advanced maternal age) multigravida 35+; and Late prenatal care affecting pregnancy in second trimester on her problem list.  Patient reports no complaints.  Contractions: Irregular. Vag. Bleeding: None.  Movement: Present. Denies leaking of fluid.   The following portions of the patient's history were reviewed and updated as appropriate: allergies, current medications, past family history, past medical history, past social history, past surgical history and problem list. Problem list updated.  Objective:   Vitals:   08/07/16 0813  BP: 128/85  Pulse: 90  Weight: 214 lb 3.2 oz (97.2 kg)    Fetal Status: Fetal Heart Rate (bpm): 132 Fundal Height: 37 cm Movement: Present  Presentation: Vertex  General:  Alert, oriented and cooperative. Patient is in no acute distress.  Skin: Skin is warm and dry. No rash noted.   Cardiovascular: Normal heart rate noted  Respiratory: Normal respiratory effort, no problems with respiration noted  Abdomen: Soft, gravid, appropriate for gestational age. Pain/Pressure: Present     Pelvic:  Cervical exam performed Dilation: 1 Effacement (%): Thick Station: Ballotable  Extremities: Normal range of motion.  Edema: None  Mental Status: Normal mood and affect. Normal behavior. Normal judgment and thought content.   Assessment and Plan:  Pregnancy: [id number] at 8958w4d  1. Supervision of high risk pregnancy in third trimester      Doing well.  GBS negative.   2. Elderly multigravida in third trimester      Reactive NST on 08/06/16  Term labor symptoms and general obstetric precautions including but not limited to vaginal bleeding, contractions, leaking of fluid and fetal movement were reviewed in  detail with the patient. Please refer to After Visit Summary for other counseling recommendations.  Return in about 1 week (around 08/14/2016) [facility].   Roe Coombsachelle A Nashonda Limberg, CNM

## 2016-08-10 ENCOUNTER — Ambulatory Visit (INDEPENDENT_AMBULATORY_CARE_PROVIDER_SITE_OTHER): Payer: Medicaid Other | Admitting: Certified Nurse Midwife

## 2016-08-10 VITALS — BP 113/78 | HR 121 | Wt 216.6 lb

## 2016-08-10 DIAGNOSIS — O0932 Supervision of pregnancy with insufficient antenatal care, second trimester: Secondary | ICD-10-CM

## 2016-08-10 DIAGNOSIS — O09523 Supervision of elderly multigravida, third trimester: Secondary | ICD-10-CM

## 2016-08-10 DIAGNOSIS — O0993 Supervision of high risk pregnancy, unspecified, third trimester: Secondary | ICD-10-CM

## 2016-08-10 NOTE — Progress Notes (Signed)
Pt complains of having a lot of pressure, lower back pain, nausea, and diarrhea. Pt would like to be checked today. Also states that she had some pink spotting, and mucus yesterday and today.

## 2016-08-10 NOTE — Progress Notes (Signed)
   PRENATAL VISIT NOTE  Subjective:  Virginia [name] is a 41 y.o. [id number] at 1156w0d being seen today for ongoing prenatal care.  She is currently monitored for the following issues for this high-risk pregnancy and has Supervision of high-risk pregnancy; AMA (advanced maternal age) multigravida 35+; and Late prenatal care affecting pregnancy in second trimester on her problem list.  Patient reports backache, fatigue, headache, nausea, no leaking, occasional contractions, vomiting and diarrhea for about 24 hours. .  Contractions: Irregular. Vag. Bleeding: Scant.  Movement: Present. Denies leaking of fluid.   The following portions of the patient's history were reviewed and updated as appropriate: allergies, current medications, past family history, past medical history, past social history, past surgical history and problem list. Problem list updated.  Objective:   Vitals:   08/10/16 1022  BP: 113/78  Pulse: (!) 121  Weight: 216 lb 9.6 oz (98.2 kg)    Fetal Status:     Movement: Present  Presentation: Vertex  General:  Alert, oriented and cooperative. Patient is in no acute distress.  Skin: Skin is warm and dry. No rash noted.   Cardiovascular: Normal heart rate noted  Respiratory: Normal respiratory effort, no problems with respiration noted  Abdomen: Soft, gravid, appropriate for gestational age. Pain/Pressure: Present     Pelvic:  Cervical exam performed Dilation: 1.5 Effacement (%): Thick Station: -3  Extremities: Normal range of motion.  Edema: None  Mental Status: Normal mood and affect. Normal behavior. Normal judgment and thought content.  NST: + accels, no decels, moderate variability, Cat. 1 tracing. No contractions on toco.   Assessment and Plan:  Pregnancy: [id number] at 1356w0d  1. Supervision of high risk pregnancy in third trimester     Reactive NST.  ?gastroenteritits: mild fever: increased fluids encouraged along with rest and OTC Tylenol.   Declines antiemetics.     2. Elderly multigravida in third trimester      - Fetal nonstress test  3. Late prenatal care affecting pregnancy in second trimester   Term labor symptoms and general obstetric precautions including but not limited to vaginal bleeding, contractions, leaking of fluid and fetal movement were reviewed in detail with the patient. Please refer to After Visit Summary for other counseling recommendations.  [facility] week (around 08/17/2016[facility] Continue Bi[facility]weekly Antenatal testing.   Roe Coombsachelle A Caoilainn Sacks, CNM

## 2016-08-13 ENCOUNTER — Ambulatory Visit (INDEPENDENT_AMBULATORY_CARE_PROVIDER_SITE_OTHER): Payer: Medicaid Other | Admitting: Certified Nurse Midwife

## 2016-08-13 DIAGNOSIS — O09523 Supervision of elderly multigravida, third trimester: Secondary | ICD-10-CM

## 2016-08-13 DIAGNOSIS — O0990 Supervision of high risk pregnancy, unspecified, unspecified trimester: Secondary | ICD-10-CM

## 2016-08-13 NOTE — Progress Notes (Signed)
Patient is in the office for NST. 

## 2016-08-13 NOTE — Progress Notes (Signed)
NST: + accels, no decels, moderate variability, Cat. 1 tracing. 1 contraction on toco.  Cervix: 2 cm, thick, midline, soft, -3 station

## 2016-08-15 ENCOUNTER — Inpatient Hospital Stay (HOSPITAL_COMMUNITY): Payer: Medicaid Other | Admitting: Anesthesiology

## 2016-08-15 ENCOUNTER — Inpatient Hospital Stay (HOSPITAL_COMMUNITY)
Admission: AD | Admit: 2016-08-15 | Discharge: 2016-08-18 | DRG: 775 | Disposition: A | Discharge status: Home / Self Care | Payer: Medicaid Other | Source: Ambulatory Visit | Attending: Family Medicine | Admitting: Family Medicine

## 2016-08-15 ENCOUNTER — Encounter (HOSPITAL_COMMUNITY): Payer: Self-pay | Admitting: *Deleted

## 2016-08-15 DIAGNOSIS — O411230 Chorioamnionitis, third trimester, not applicable or unspecified: Principal | ICD-10-CM | POA: Diagnosis present

## 2016-08-15 DIAGNOSIS — Z3493 Encounter for supervision of normal pregnancy, unspecified, third trimester: Secondary | ICD-10-CM | POA: Diagnosis present

## 2016-08-15 DIAGNOSIS — Z3A38 38 weeks gestation of pregnancy: Secondary | ICD-10-CM

## 2016-08-15 LAB — CBC
HEMATOCRIT: 34.9 % — AB (ref 36.0–46.0)
Hemoglobin: 12 g/dL (ref 12.0–15.0)
MCH: 32.1 pg (ref 26.0–34.0)
MCHC: 34.4 g/dL (ref 30.0–36.0)
MCV: 93.3 fL (ref 78.0–100.0)
PLATELETS: 153 10*3/uL (ref 150–400)
RBC: 3.74 MIL/uL — ABNORMAL LOW (ref 3.87–5.11)
RDW: 14.4 % (ref 11.5–15.5)
WBC: 10.9 10*3/uL — AB (ref 4.0–10.5)

## 2016-08-15 LAB — TYPE AND SCREEN
ABO/RH(D): O POS
ANTIBODY SCREEN: NEGATIVE

## 2016-08-15 MED ORDER — LACTATED RINGERS IV SOLN
500.0000 mL | INTRAVENOUS | Status: DC | PRN
  Administered 2016-08-16: 1000 mL via INTRAVENOUS

## 2016-08-15 MED ORDER — PHENYLEPHRINE 40 MCG/ML (10ML) SYRINGE FOR IV PUSH (FOR BLOOD PRESSURE SUPPORT)
80.0000 ug | PREFILLED_SYRINGE | INTRAVENOUS | Status: DC | PRN
  Filled 2016-08-15: qty 10
  Filled 2016-08-15: qty 5

## 2016-08-15 MED ORDER — OXYCODONE-ACETAMINOPHEN 5-325 MG PO TABS: 1.0000 | ORAL_TABLET | ORAL | Status: DC | PRN

## 2016-08-15 MED ORDER — EPHEDRINE 5 MG/ML INJ
10.0000 mg | INTRAVENOUS | Status: DC | PRN
  Filled 2016-08-15: qty 2

## 2016-08-15 MED ORDER — FENTANYL 2.5 MCG/ML BUPIVACAINE 1/10 % EPIDURAL INFUSION (WH - ANES)
14.0000 mL/h | INTRAMUSCULAR | Status: DC | PRN
  Administered 2016-08-15 (×2): 14 mL/h via EPIDURAL
  Filled 2016-08-15 (×2): qty 100

## 2016-08-15 MED ORDER — PHENYLEPHRINE 40 MCG/ML (10ML) SYRINGE FOR IV PUSH (FOR BLOOD PRESSURE SUPPORT)
80.0000 ug | PREFILLED_SYRINGE | INTRAVENOUS | Status: DC | PRN
  Filled 2016-08-15: qty 5

## 2016-08-15 MED ORDER — LIDOCAINE HCL (PF) 1 % IJ SOLN
INTRAMUSCULAR | Status: DC | PRN
  Administered 2016-08-15 (×2): 5 mL via EPIDURAL

## 2016-08-15 MED ORDER — OXYTOCIN BOLUS FROM INFUSION
500.0000 mL | Freq: Once | INTRAVENOUS | Status: AC
  Administered 2016-08-16: 500 mL via INTRAVENOUS

## 2016-08-15 MED ORDER — ACETAMINOPHEN 325 MG PO TABS
650.0000 mg | ORAL_TABLET | ORAL | Status: DC | PRN
  Administered 2016-08-16: 650 mg via ORAL
  Filled 2016-08-15: qty 2

## 2016-08-15 MED ORDER — LACTATED RINGERS IV SOLN
500.0000 mL | Freq: Once | INTRAVENOUS | Status: AC
  Administered 2016-08-15: 500 mL via INTRAVENOUS

## 2016-08-15 MED ORDER — SOD CITRATE-CITRIC ACID 500-334 MG/5ML PO SOLN: 30.0000 mL | ORAL | Status: DC | PRN

## 2016-08-15 MED ORDER — TERBUTALINE SULFATE 1 MG/ML IJ SOLN
0.2500 mg | Freq: Once | INTRAMUSCULAR | Status: DC | PRN
  Filled 2016-08-15: qty 1

## 2016-08-15 MED ORDER — OXYTOCIN 40 UNITS IN LACTATED RINGERS INFUSION - SIMPLE MED
1.0000 m[IU]/min | INTRAVENOUS | Status: DC
  Administered 2016-08-15: 2 m[IU]/min via INTRAVENOUS

## 2016-08-15 MED ORDER — LACTATED RINGERS IV SOLN
INTRAVENOUS | Status: DC
  Administered 2016-08-15 (×2): via INTRAVENOUS

## 2016-08-15 MED ORDER — OXYCODONE-ACETAMINOPHEN 5-325 MG PO TABS: 2.0000 | ORAL_TABLET | ORAL | Status: DC | PRN

## 2016-08-15 MED ORDER — LIDOCAINE HCL (PF) 1 % IJ SOLN
30.0000 mL | INTRAMUSCULAR | Status: DC | PRN
  Filled 2016-08-15: qty 30

## 2016-08-15 MED ORDER — OXYTOCIN 40 UNITS IN LACTATED RINGERS INFUSION - SIMPLE MED
2.5000 [IU]/h | INTRAVENOUS | Status: DC
  Filled 2016-08-15: qty 1000

## 2016-08-15 MED ORDER — FENTANYL CITRATE (PF) 100 MCG/2ML IJ SOLN
100.0000 ug | Freq: Once | INTRAMUSCULAR | Status: AC
  Administered 2016-08-15: 100 ug via INTRAVENOUS
  Filled 2016-08-15: qty 2

## 2016-08-15 MED ORDER — FLEET ENEMA 7-19 GM/118ML RE ENEM: 1.0000 | ENEMA | RECTAL | Status: DC | PRN

## 2016-08-15 MED ORDER — ONDANSETRON HCL 4 MG/2ML IJ SOLN: 4.0000 mg | Freq: Four times a day (QID) | INTRAMUSCULAR | Status: DC | PRN

## 2016-08-15 MED ORDER — DIPHENHYDRAMINE HCL 50 MG/ML IJ SOLN: 12.5000 mg | INTRAMUSCULAR | Status: DC | PRN

## 2016-08-15 NOTE — Progress Notes (Signed)
Patient ID: Rebecka Dzentutu KPOH-RETVIG, female   DOB: 15-May-1975, 41 y.o.   MRN: [id number]  S: Patient seen & examined for progress of labor. Patient comfortable with epidural now.    O:  Vitals:   08/15/16 1800 08/15/16 1830 08/15/16 1900 08/15/16 1930  BP: 122/69 124/79 (!) 144/77 132/81  Pulse: 69 72 90 77  Resp: 18 16 18 20   Temp: 97.9 F (36.6 C)   98.1 F (36.7 C)  TempSrc: Oral   Oral  SpO2:      Weight:      Height:        Dilation: 4 Effacement (%): 80 Cervical Position: Posterior Station: -1 Presentation: Vertex Exam by:: Londell Mohhristy Goodman, RN   FHT: 130bpm, mod var, +accels, no decels TOCO: q3-624min   A/P: Minimal cervical change since admission, will start pitocin to augment labor Continue expectant management Anticipate SVD

## 2016-08-15 NOTE — MAU Note (Signed)
Pt reports she has had ctx since yesterday . Water broke.Pt very uncomfortable requesting pain medication. Not listening to directions. Unable to stay still in bed. Side rails up . SVE 4-5/50/-2. D.Lawson notified admit orders given

## 2016-08-15 NOTE — Anesthesia Preprocedure Evaluation (Signed)
Anesthesia Evaluation  Patient identified by MRN, date of birth, ID band Patient awake    Reviewed: Allergy & Precautions, H&P , NPO status , Patient's Chart, lab work & pertinent test results  Airway Mallampati: III  TM Distance: >3 FB Neck ROM: full    Dental no notable dental hx. (+) Teeth Intact, Dental Advisory Given   Pulmonary neg pulmonary ROS,    Pulmonary exam normal breath sounds clear to auscultation       Cardiovascular negative cardio ROS   Rhythm:regular Rate:Normal     Neuro/Psych negative neurological ROS  negative psych ROS   GI/Hepatic negative GI ROS, Neg liver ROS,   Endo/Other  negative endocrine ROS  Renal/GU negative Renal ROS  negative genitourinary   Musculoskeletal   Abdominal Normal abdominal exam  (+)   Peds  Hematology negative hematology ROS (+)   Anesthesia Other Findings   Reproductive/Obstetrics (+) Pregnancy                             Anesthesia Physical  Anesthesia Plan  ASA: II  Anesthesia Plan: Epidural   Post-op Pain Management:    Induction:   PONV Risk Score and Plan:   Airway Management Planned:   Additional Equipment:   Intra-op Plan:   Post-operative Plan:   Informed Consent: I have reviewed the patients History and Physical, chart, labs and discussed the procedure including the risks, benefits and alternatives for the proposed anesthesia with the patient or authorized representative who has indicated his/her understanding and acceptance.   Dental advisory given  Plan Discussed with: Anesthesiologist  Anesthesia Plan Comments:         Anesthesia Quick Evaluation

## 2016-08-15 NOTE — Progress Notes (Signed)
Called to room to confirm nursing cervical exam. Patient w/ mild pain, 150/mod/+a/early decels. 5.5/70/-1, prominent ischial spine noted. Contracting 3-4 x every 10 minutes, will decrease pitocin. Patient also clarified that SROM occurred at 14:00 today.

## 2016-08-15 NOTE — H&P (Signed)
LABOR AND DELIVERY ADMISSION HISTORY AND PHYSICAL NOTE  Virginia [name] is a 41 y.o. female [id number] with IUP at 3472w5d by LMP presenting for SROM/SOL.   Patient states she has been contracting regularly for the past 2 days, but got more intense today after her water broke at 05:00. She reports positive fetal movement. She denies vaginal bleeding.     Past Medical History: Past Medical History:  Diagnosis Date  . NFAOZHYQ(657.8Headache(784.0)     Past Surgical History: Past Surgical History:  Procedure Laterality Date  . NO PAST SURGERIES      Obstetrical History: OB History    Gravida Para Term Preterm AB Living   4 2 2  0 1 2   SAB TAB Ectopic Multiple Live Births   1 0 0 0 2      Social History: Social History   Social History  . Marital status: Married    Spouse name: N/A  . Number of children: N/A  . Years of education: N/A   Social History Main Topics  . Smoking status: Never Smoker  . Smokeless tobacco: Never Used  . Alcohol use No  . Drug use: No  . Sexual activity: Not Currently    Birth control/ protection: None   Other Topics Concern  . None   Social History Narrative  . None    Family History: History reviewed. No pertinent family history.  Allergies: No Known Allergies  Prescriptions Prior to Admission  Medication Sig Dispense Refill Last Dose  . folic acid (FOLVITE) 1 MG tablet Take 1 mg by mouth daily.   Taking  . Omega-3 Fatty Acids (FISH OIL PO) Take 1 capsule by mouth daily.   Taking  . Prenat-FeAsp-Meth-FA-DHA w/o A (PRENATE PIXIE) 10-0.6-0.4-200 MG CAPS Take 1 tablet by mouth daily. 30 capsule 12 Taking     Review of Systems   All systems reviewed and negative except as stated in HPI  Physical Exam Last menstrual period 11/18/2015, unknown if currently breastfeeding. General appearance: alert, cooperative, appears stated age, no distress and mildly obese Lungs: clear to auscultation bilaterally Heart: regular rate and  rhythm Abdomen: soft, non-tender; bowel sounds normal Extremities: No calf swelling or tenderness Presentation: cephalic Fetal monitoring: 125 bpm, mod var, +accels, no decels Uterine activity: q2-483min Dilation: 4.5 Effacement (%): 50 Station: -2, -3 Exam by:: K.Wilosn,RN   Prenatal labs: ABO, Rh: O/Positive/-- (01/10 1211) Antibody: Negative (01/10 1211) Rubella: !Error! IMMUNE RPR: Non Reactive (04/18 1050)  HBsAg: Negative (01/10 1211)  HIV: Non Reactive (04/18 1050)  GBS: Negative (06/11 1531)  2 hr Glucola: Normal 69/100/94 Genetic screening:  Normal Anatomy US: Normal - girl  Prenatal Transfer Tool  Maternal Diabetes: No Genetic Screening: Normal Maternal Ultrasounds/Referrals: Normal Fetal Ultrasounds or other Referrals:  None Maternal Substance Abuse:  No Significant Maternal Medications:  None Significant Maternal Lab Results: Lab values include: Group B Strep negative  No results found for this or any previous visit (from the past 24 hour(s)).  Patient Active Problem List   Diagnosis Date Noted  . Normal labor 08/15/2016  . Supervision of high-risk pregnancy 02/26/2016  . AMA (advanced maternal age) multigravida 35+ 02/26/2016  . Late prenatal care affecting pregnancy in second trimester 02/26/2016    Assessment: Virginia [name] is a 41 y.o. [id number] at 8172w5d here for SOL/SROM.   #Labor: In active labor, will augment as needed due to patient SROM - clear #Pain: Requesting epidural #FWB: Cat I #ID:  GBS Neg #MOF: Breastfeeding #MOC: BTL (  has not signed papers yet), would prefer to "donate her uterus" #Circ:  N/A- girl  Jen Mow, DO OB Fellow Center for [facility], [facility] 08/15/2016, 3:44 PM

## 2016-08-15 NOTE — Progress Notes (Signed)
RN at bedside attempting to monitor FHR.  Difficult due to patient movement.  MD aware and at bedside to assess patient status.

## 2016-08-15 NOTE — Anesthesia Procedure Notes (Signed)
Epidural Patient location during procedure: OB Start time: 08/15/2016 4:06 PM End time: 08/15/2016 4:21 PM  Staffing Anesthesiologist: Heather RobertsSINGER, Caelynn Marshman Performed: anesthesiologist   Preanesthetic Checklist Completed: patient identified, site marked, pre-op evaluation, timeout performed, IV checked, risks and benefits discussed and monitors and equipment checked  Epidural Patient position: sitting Prep: DuraPrep Patient monitoring: heart rate, cardiac monitor, continuous pulse ox and blood pressure Approach: midline Location: L2-L3 Injection technique: LOR saline  Needle:  Needle type: Tuohy  Needle gauge: 17 G Needle length: 9 cm Needle insertion depth: 7 cm Catheter size: 20 Guage Catheter at skin depth: 11 cm Test dose: negative and Other  Assessment Events: blood not aspirated, injection not painful, no injection resistance and negative IV test  Additional Notes Informed consent obtained prior to proceeding including risk of failure, 1% risk of PDPH, risk of minor discomfort and bruising.  Discussed rare but serious complications including epidural abscess, permanent nerve injury, epidural hematoma.  Discussed alternatives to epidural analgesia and patient desires to proceed.  Timeout performed pre-procedure verifying patient name, procedure, and platelet count.  Patient tolerated procedure well.

## 2016-08-16 ENCOUNTER — Encounter (HOSPITAL_COMMUNITY): Payer: Self-pay

## 2016-08-16 DIAGNOSIS — Z3A38 38 weeks gestation of pregnancy: Secondary | ICD-10-CM

## 2016-08-16 LAB — RPR: RPR Ser Ql: NONREACTIVE

## 2016-08-16 MED ORDER — SIMETHICONE 80 MG PO CHEW
80.0000 mg | CHEWABLE_TABLET | ORAL | Status: DC | PRN
Start: 2016-08-16 — End: 2016-08-18

## 2016-08-16 MED ORDER — LACTATED RINGERS IV SOLN
INTRAVENOUS | Status: DC
  Administered 2016-08-16: 02:00:00 via INTRAUTERINE

## 2016-08-16 MED ORDER — COCONUT OIL OIL: 1.0000 "application " | TOPICAL_OIL | Status: DC | PRN

## 2016-08-16 MED ORDER — DIBUCAINE 1 % RE OINT: 1.0000 "application " | TOPICAL_OINTMENT | RECTAL | Status: DC | PRN

## 2016-08-16 MED ORDER — SENNOSIDES-DOCUSATE SODIUM 8.6-50 MG PO TABS
2.0000 | ORAL_TABLET | ORAL | Status: DC
  Administered 2016-08-17 (×2): 2 via ORAL
  Filled 2016-08-16 (×2): qty 2

## 2016-08-16 MED ORDER — BENZOCAINE-MENTHOL 20-0.5 % EX AERO
1.0000 "application " | INHALATION_SPRAY | CUTANEOUS | Status: DC | PRN
  Administered 2016-08-16 – 2016-08-18 (×4): 1 via TOPICAL
  Filled 2016-08-16 (×4): qty 56

## 2016-08-16 MED ORDER — WITCH HAZEL-GLYCERIN EX PADS: 1.0000 "application " | MEDICATED_PAD | CUTANEOUS | Status: DC | PRN

## 2016-08-16 MED ORDER — ONDANSETRON HCL 4 MG PO TABS: 4.0000 mg | ORAL_TABLET | ORAL | Status: DC | PRN

## 2016-08-16 MED ORDER — ONDANSETRON HCL 4 MG/2ML IJ SOLN: 4.0000 mg | INTRAMUSCULAR | Status: DC | PRN

## 2016-08-16 MED ORDER — ZOLPIDEM TARTRATE 5 MG PO TABS: 5.0000 mg | ORAL_TABLET | Freq: Every evening | ORAL | Status: DC | PRN

## 2016-08-16 MED ORDER — GENTAMICIN SULFATE 40 MG/ML IJ SOLN
5.0000 mg/kg | INTRAMUSCULAR | Status: DC
  Administered 2016-08-16: 370 mg via INTRAVENOUS
  Filled 2016-08-16: qty 9.25

## 2016-08-16 MED ORDER — IBUPROFEN 600 MG PO TABS
600.0000 mg | ORAL_TABLET | Freq: Four times a day (QID) | ORAL | Status: DC
  Administered 2016-08-16 – 2016-08-18 (×9): 600 mg via ORAL
  Filled 2016-08-16 (×10): qty 1

## 2016-08-16 MED ORDER — MEASLES, MUMPS & RUBELLA VAC ~~LOC~~ INJ: 0.5000 mL | INJECTION | Freq: Once | SUBCUTANEOUS | Status: DC

## 2016-08-16 MED ORDER — TETANUS-DIPHTH-ACELL PERTUSSIS 5-2.5-18.5 LF-MCG/0.5 IM SUSP: 0.5000 mL | Freq: Once | INTRAMUSCULAR | Status: DC

## 2016-08-16 MED ORDER — PRENATAL MULTIVITAMIN CH
1.0000 | ORAL_TABLET | Freq: Every day | ORAL | Status: DC
  Administered 2016-08-17: 1 via ORAL
  Filled 2016-08-16: qty 1

## 2016-08-16 MED ORDER — SODIUM CHLORIDE 0.9 % IV SOLN
2.0000 g | Freq: Four times a day (QID) | INTRAVENOUS | Status: AC
  Administered 2016-08-16: 2 g via INTRAVENOUS
  Filled 2016-08-16: qty 2000

## 2016-08-16 MED ORDER — OXYCODONE HCL 5 MG PO TABS
5.0000 mg | ORAL_TABLET | ORAL | Status: DC | PRN
  Administered 2016-08-16: 5 mg via ORAL
  Filled 2016-08-16: qty 1

## 2016-08-16 MED ORDER — ACETAMINOPHEN 325 MG PO TABS
650.0000 mg | ORAL_TABLET | ORAL | Status: DC | PRN
  Administered 2016-08-16: 650 mg via ORAL
  Filled 2016-08-16: qty 2

## 2016-08-16 MED ORDER — DIPHENHYDRAMINE HCL 25 MG PO CAPS: 25.0000 mg | ORAL_CAPSULE | Freq: Four times a day (QID) | ORAL | Status: DC | PRN

## 2016-08-16 MED ORDER — SODIUM CHLORIDE 0.9 % IV SOLN
2.0000 g | Freq: Four times a day (QID) | INTRAVENOUS | Status: DC
  Administered 2016-08-16: 2 g via INTRAVENOUS
  Filled 2016-08-16 (×3): qty 2000

## 2016-08-16 NOTE — Progress Notes (Signed)
LABOR PROGRESS NOTE  Shakita Dzentutu KPOH-RETVIG is a 41 y.o. [id number] at 6420w6d  admitted for sol  Subjective: Mild pain. Febrile.   Objective: BP (!) 141/86   Pulse 87   Temp (!) 102.6 F (39.2 C) (Axillary)   Resp 18   Ht 5\' 6"  (1.676 m)   Wt 217 lb (98.4 kg)   LMP 11/18/2015 (Approximate)   SpO2 100%   BMI 35.02 kg/m  or  Vitals:   08/15/16 2300 08/15/16 2330 08/16/16 0022 08/16/16 0023  BP: (!) 148/92 (!) 141/86    Pulse: (!) 109 87    Resp:  18    Temp: 98.7 F (37.1 C)  (!) 100.6 F (38.1 C) (!) 102.6 F (39.2 C)  TempSrc: Oral  Oral Axillary  SpO2:      Weight:      Height:        160/mod/-a/-d Dilation: 5.5 Effacement (%): 80 Cervical Position: Middle Station: -2 Presentation: Vertex Exam by:: Dr. Ashok PallWouk  Labs: Lab Results  Component Value Date   WBC 10.9 (H) 08/15/2016   HGB 12.0 08/15/2016   HCT 34.9 (L) 08/15/2016   MCV 93.3 08/15/2016   PLT 153 08/15/2016    Patient Active Problem List   Diagnosis Date Noted  . Normal labor 08/15/2016  . Supervision of high-risk pregnancy 02/26/2016  . AMA (advanced maternal age) multigravida 35+ 02/26/2016  . Late prenatal care affecting pregnancy in second trimester 02/26/2016    Assessment / Plan: 41 y.o. [id number] at 2720w6d here for sol, now with likely triple I  Labor: s/p srom 10 hours ago. Now with fetal tachycardia and fever. Will give 1 L NS bolus and re-check temp again in 30 minutes. Assuming remains elevated will start amp/gent for triple I. Continuing pitocin. Fetal Wellbeing:  Cat 2 Pain Control:  epidural Anticipated MOD:  vaginal  Silvano BilisNoah B Brasen Bundren, MD 08/16/2016, 12:30 AM

## 2016-08-16 NOTE — Progress Notes (Signed)
Pt oral temp 100.6 at 0022 and axillary at 0023 was 102.6. RN called provider. Provider wants a 1000 ml of fluid recheck temp in 30 mins and call back

## 2016-08-16 NOTE — Lactation Note (Signed)
This note was copied from a baby's chart. Lactation Consultation Note  Patient Name: Virginia [name] Today's Date: 08/16/2016 Reason for consult: Initial assessment   With this mom of a term baby, now 728 hours old. Mom reports baby latching for about 2 minutes, and then asleep. Mom eating lunch at this time. and baby asleep in crib.  Mom states baby has no problem latching, just staying awake. I reviewed normal newborn behavior with mom, encouraged her to hand express prior to latching, and keep baby skin to skin as much as possible . I told mom if baby does not feed at least every 3 hours, she can hand express colostrum and spoon feed her. Mom knows to ask for help with this as needed.  Basic breast feeding teaching reviewed with mom, and lactation sevrices also. Mom was asked to call when baby next latches, for latch to be observed by lactation. Mom is an experienced breast feeder with her other 2 children.    Maternal Data    Feeding Feeding Type: Breast Fed Length of feed: 5 min  LATCH Score/Interventions                      Lactation Tools Discussed/Used     Consult Status Consult Status: Follow-up Date: 08/17/16 Follow-up type: In-patient    [name], [name] 08/16/2016, 2:16 PM

## 2016-08-16 NOTE — Anesthesia Postprocedure Evaluation (Signed)
Anesthesia Post Note  Patient: Virginia [name] [id number]  Procedure(s) Performed: * No procedures listed *     Patient location during evaluation: Mother Baby Anesthesia Type: Epidural Level of consciousness: awake and alert and oriented Pain management: pain level controlled Vital Signs Assessment: post-procedure vital signs reviewed and stable Respiratory status: spontaneous breathing and nonlabored ventilation Cardiovascular status: stable Postop Assessment: no headache, patient able to bend at knees, no backache, no signs of nausea or vomiting, epidural receding and adequate PO intake Anesthetic complications: no    Last Vitals:  Vitals:   08/16/16 0900 08/16/16 1349  BP: 127/73 (!) 114/58  Pulse: 98 65  Resp: 18 20  Temp: 36.7 C 36.6 C    Last Pain:  Vitals:   08/16/16 1349  TempSrc: Oral  PainSc:    Pain Goal:                 Land O'LakesMalinova,Dandrae Kustra Hristova

## 2016-08-16 NOTE — Progress Notes (Signed)
Here for sol, now with triple I. Amp/gent running. 155/mod/-a/occasional variable. 6/90/-2. IUPC placed for pitocin titration and amnioinfusion should variables become recurrent. FSE also placed for difficulty maintaining fetal heart rate monitoring.

## 2016-08-17 ENCOUNTER — Encounter (HOSPITAL_COMMUNITY): Payer: Self-pay

## 2016-08-17 ENCOUNTER — Encounter: Payer: Medicaid Other | Admitting: Certified Nurse Midwife

## 2016-08-17 MED ORDER — OXYCODONE HCL 5 MG PO TABS
5.0000 mg | ORAL_TABLET | ORAL | Status: DC | PRN
  Administered 2016-08-17 – 2016-08-18 (×3): 5 mg via ORAL
  Filled 2016-08-17 (×3): qty 1

## 2016-08-17 MED ORDER — OXYCODONE-ACETAMINOPHEN 5-325 MG PO TABS: 2.0000 | ORAL_TABLET | ORAL | Status: DC | PRN

## 2016-08-17 NOTE — Progress Notes (Signed)
Post Partum Day #1 Subjective: no complaints, up ad lib, voiding and tolerating PO  Objective: Blood pressure 126/71, pulse (!) 50, temperature 98.1 F (36.7 C), temperature source Oral, resp. rate 17, height 5\' 6"  (1.676 m), weight 217 lb (98.4 kg), last menstrual period 11/18/2015, SpO2 100 %, unknown if currently breastfeeding.  Physical Exam:  General: alert, cooperative and no distress Lochia: appropriate Uterine Fundus: firm Incision: no significant drainage, no dehiscence, no significant erythema DVT Evaluation: No evidence of DVT seen on physical exam. No cords or calf tenderness. No significant calf/ankle edema.   Recent Labs  08/15/16 1527  HGB 12.0  HCT 34.9*    Assessment/Plan: Plan for discharge tomorrow, Breastfeeding and Contraception interval BTL planned   LOS: 2 days   Roe CoombsRachelle A Teal Bontrager, CNM 08/17/2016, 7:40 AM

## 2016-08-17 NOTE — Progress Notes (Signed)
NST 08/06/16 reactive 

## 2016-08-18 MED ORDER — IBUPROFEN 600 MG PO TABS
600.0000 mg | ORAL_TABLET | Freq: Four times a day (QID) | ORAL | 2 refills | Status: DC
Start: 2016-08-18 — End: 2016-09-30

## 2016-08-18 MED ORDER — SENNOSIDES-DOCUSATE SODIUM 8.6-50 MG PO TABS: 2.0000 | ORAL_TABLET | Freq: Every day | ORAL | 2 refills | Status: DC

## 2016-08-18 MED ORDER — BENZOCAINE-MENTHOL 20-0.5 % EX AERO: 1.0000 "application " | INHALATION_SPRAY | CUTANEOUS | 0 refills | Status: DC | PRN

## 2016-08-18 MED ORDER — OXYCODONE-ACETAMINOPHEN 5-325 MG PO TABS: 2.0000 | ORAL_TABLET | ORAL | 0 refills | Status: DC | PRN

## 2016-08-18 NOTE — Progress Notes (Signed)
Post Partum Day #2 Subjective: no complaints, up ad lib, voiding and tolerating PO  Objective: Blood pressure 125/69, pulse 67, temperature 97.9 F (36.6 C), temperature source Oral, resp. rate 18, height 5\' 6"  (1.676 m), weight 217 lb (98.4 kg), last menstrual period 11/18/2015, SpO2 100 %, unknown if currently breastfeeding.  Physical Exam:  General: alert, cooperative and no distress Lochia: appropriate Uterine Fundus: firm Incision: lac healing DVT Evaluation: No evidence of DVT seen on physical exam. No cords or calf tenderness. No significant calf/ankle edema.   Recent Labs  08/15/16 1527  HGB 12.0  HCT 34.9*    Assessment/Plan: Discharge home, Breastfeeding and Contraception undecided   LOS: 3 days   Roe CoombsRachelle A Damoney Julia, CNM 08/18/2016, 8:23 AM

## 2016-08-18 NOTE — Discharge Summary (Signed)
OB Discharge Summary     Patient Name: Virginia [name] DOB: January 04, 1976 MRN: [id number]  Date of admission: 08/15/2016 Delivering MD: Shonna ChockWOUK, NOAH BEDFORD   Date of discharge: 08/18/2016  Admitting diagnosis: 7338 WKS, LABOR, WATER BROKE Intrauterine pregnancy: 4720w6d     Secondary diagnosis:  Active Problems:   Normal labor  Additional problems: AMA, Chorio,      Discharge diagnosis: Term Pregnancy Delivered                                                                                                Post partum procedures:none  Augmentation: Pitocin  Complications: Intrauterine Inflammation or infection (Chorioamniotis)  Hospital course:  Onset of Labor With Vaginal Delivery     [age] y.o. yo [id number] at 220w6d was admitted in Latent Labor on 08/15/2016. Patient had an uncomplicated labor course as follows:  Membrane Rupture Time/Date: 2:58 PM ,08/15/2016   Intrapartum Procedures: Episiotomy: None [1]                                         Lacerations:  1st degree [2];Perineal [11]  Patient had a delivery of a Viable infant. 08/16/2016  Information for the patient's newborn:  Virginia [name], Girl Virginia [name] [[id number]][[id number]]  Delivery Method: Vag-Spont    Pateint had an uncomplicated postpartum course.  She is ambulating, tolerating a regular diet, passing flatus, and urinating well. Patient is discharged home in stable condition on 08/18/16.   Physical exam  Vitals:   08/16/16 1349 08/17/16 0407 08/17/16 1805 08/18/16 0529  BP: (!) 114/58 126/71 128/64 125/69  Pulse: 65 (!) 50 64 67  Resp: 20 17 19 18   Temp: 97.9 F (36.6 C) 98.1 F (36.7 C) 97.7 F (36.5 C) 97.9 F (36.6 C)  TempSrc: Oral Oral Oral Oral  SpO2:   100%   Weight:      Height:       General: alert, cooperative and no distress Lochia: appropriate Uterine Fundus: firm Incision: N/A DVT Evaluation: No evidence of DVT seen on physical exam. No cords or calf tenderness. No significant calf/ankle edema. Labs: Lab  Results  Component Value Date   WBC 10.9 (H) 08/15/2016   HGB 12.0 08/15/2016   HCT 34.9 (L) 08/15/2016   MCV 93.3 08/15/2016   PLT 153 08/15/2016   No flowsheet data found.  Discharge instruction: per After Visit Summary and "Baby and Me Booklet".  After visit meds:  Allergies as of 08/18/2016   No Known Allergies     Medication List    STOP taking these medications   folic acid 1 MG tablet Commonly known as:  FOLVITE     TAKE these medications   acetaminophen 325 MG tablet Commonly known as:  TYLENOL Take 650 mg by mouth every 6 (six) hours as needed for mild pain, moderate pain, fever or headache.   benzocaine-Menthol 20-0.5 % Aero Commonly known as:  DERMOPLAST Apply 1 application topically as needed for irritation (perineal discomfort).   ibuprofen 600 MG tablet  Commonly known as:  ADVIL,MOTRIN Take 1 tablet (600 mg total) by mouth every 6 (six) hours.   omega-3 acid ethyl esters 1 g capsule Commonly known as:  LOVAZA Take 1 g by mouth at bedtime.   oxyCODONE-acetaminophen 5-325 MG tablet Commonly known as:  PERCOCET/ROXICET Take 2 tablets by mouth every 4 (four) hours as needed for severe pain (Pain scale >7).   PRENATE PIXIE 10-0.6-0.4-200 MG Caps Take 1 capsule by mouth at bedtime.   senna-docusate 8.6-50 MG tablet Commonly known as:  Senokot-S Take 2 tablets by mouth at bedtime.       Diet: routine diet  Activity: Advance as tolerated. Pelvic rest for 6 weeks.   Outpatient follow up:4 weeks Follow up Appt: Future Appointments Date Time Provider Department Center  09/16/2016 10:00 AM Adam Phenix, MD [facility] None   Follow up Visit:No Follow-up on file.  Postpartum contraception: Undecided  Newborn Data: Live born female  Birth Weight: 6 lb 10.5 oz (3020 g) APGAR: 8, 9  Baby Feeding: Breast Disposition:home with mother   08/18/2016 Roe Coombs, CNM

## 2016-08-18 NOTE — Discharge Instructions (Signed)

## 2016-08-18 NOTE — Lactation Note (Signed)
This note was copied from a baby's chart. Lactation Consultation Note  Patient Name: Virginia [name] Today's Date: 08/18/2016 Reason for consult: Follow-up assessment Assisted Mom at this visit with obtaining/sustaining more depth with latch. Mom plans to supplement baby till her milk comes to volume and weight loss stabilizes. Encouraged to post pump with feedings to encourage milk production and to have EBM to supplement. Supplemental guidelines per hours of age given to and reviewed with Mom. Hand pump given for home use, flange 27 given as well. Mom also reports she has DEBP at home but not sure if still working. Advised to use DEBP if available. Advised baby should be at breast on demand but at least 8-12 times in 24 hours. Try to keep baby nursing for 15-30 minutes both breasts some feedings.  Engorgement care reviewed if needed. Advised of OP services and support group. Encouraged to call for questions/concerns.   Maternal Data    Feeding Feeding Type: Breast Fed  LATCH Score/Interventions Latch: Grasps breast easily, tongue down, lips flanged, rhythmical sucking. Intervention(s): Adjust position;Assist with latch;Breast massage;Breast compression  Audible Swallowing: A few with stimulation  Type of Nipple: Everted at rest and after stimulation  Comfort (Breast/Nipple): Soft / non-tender     Hold (Positioning): Assistance needed to correctly position infant at breast and maintain latch.  LATCH Score: 8  Lactation Tools Discussed/Used Tools: Pump;Flanges Flange Size: 27 Breast pump type: Manual   Consult Status Consult Status: Complete Date: 08/18/16 Follow-up type: In-patient    Alfred LevinsGranger, Kelsa Jaworowski Ann 08/18/2016, 9:48 AM

## 2016-08-20 ENCOUNTER — Other Ambulatory Visit: Payer: Medicaid Other

## 2016-08-24 ENCOUNTER — Inpatient Hospital Stay (HOSPITAL_COMMUNITY): Admission: RE | Admit: 2016-08-24 | Payer: Medicaid Other | Source: Ambulatory Visit

## 2016-08-25 ENCOUNTER — Ambulatory Visit (INDEPENDENT_AMBULATORY_CARE_PROVIDER_SITE_OTHER): Payer: Medicaid Other | Admitting: Certified Nurse Midwife

## 2016-08-25 ENCOUNTER — Encounter (HOSPITAL_COMMUNITY): Payer: Self-pay | Admitting: *Deleted

## 2016-08-25 ENCOUNTER — Encounter: Payer: Self-pay | Admitting: Certified Nurse Midwife

## 2016-08-25 ENCOUNTER — Inpatient Hospital Stay (HOSPITAL_COMMUNITY)
Admission: AD | Admit: 2016-08-25 | Discharge: 2016-08-25 | Disposition: A | Discharge status: Home / Self Care | Payer: Medicaid Other | Source: Ambulatory Visit | Attending: Family Medicine | Admitting: Family Medicine

## 2016-08-25 VITALS — BP 159/102

## 2016-08-25 DIAGNOSIS — I10 Essential (primary) hypertension: Secondary | ICD-10-CM | POA: Diagnosis present

## 2016-08-25 DIAGNOSIS — O165 Unspecified maternal hypertension, complicating the puerperium: Secondary | ICD-10-CM | POA: Insufficient documentation

## 2016-08-25 DIAGNOSIS — O9089 Other complications of the puerperium, not elsewhere classified: Secondary | ICD-10-CM | POA: Insufficient documentation

## 2016-08-25 DIAGNOSIS — M6289 Other specified disorders of muscle: Secondary | ICD-10-CM

## 2016-08-25 DIAGNOSIS — R102 Pelvic and perineal pain: Secondary | ICD-10-CM | POA: Insufficient documentation

## 2016-08-25 DIAGNOSIS — N949 Unspecified condition associated with female genital organs and menstrual cycle: Secondary | ICD-10-CM

## 2016-08-25 DIAGNOSIS — R51 Headache: Secondary | ICD-10-CM | POA: Diagnosis present

## 2016-08-25 DIAGNOSIS — R030 Elevated blood-pressure reading, without diagnosis of hypertension: Secondary | ICD-10-CM

## 2016-08-25 LAB — COMPREHENSIVE METABOLIC PANEL
ALBUMIN: 3.7 g/dL (ref 3.5–5.0)
ALT: 72 U/L — ABNORMAL HIGH (ref 14–54)
AST: 28 U/L (ref 15–41)
Alkaline Phosphatase: 88 U/L (ref 38–126)
Anion gap: 8 (ref 5–15)
BILIRUBIN TOTAL: 0.8 mg/dL (ref 0.3–1.2)
BUN: 10 mg/dL (ref 6–20)
CO2: 24 mmol/L (ref 22–32)
Calcium: 8.6 mg/dL — ABNORMAL LOW (ref 8.9–10.3)
Chloride: 105 mmol/L (ref 101–111)
Creatinine, Ser: 0.85 mg/dL (ref 0.44–1.00)
GFR calc Af Amer: >60 mL/min (ref >60)
GFR calc non Af Amer: >60 mL/min (ref >60)
GLUCOSE: 97 mg/dL (ref 65–99)
POTASSIUM: 3.4 mmol/L — AB (ref 3.5–5.1)
Sodium: 137 mmol/L (ref 135–145)
Total Protein: 7.7 g/dL (ref 6.5–8.1)

## 2016-08-25 LAB — CBC
HEMATOCRIT: 38.1 % (ref 36.0–46.0)
Hemoglobin: 12.9 g/dL (ref 12.0–15.0)
MCH: 31.9 pg (ref 26.0–34.0)
MCHC: 33.9 g/dL (ref 30.0–36.0)
MCV: 94.3 fL (ref 78.0–100.0)
Platelets: 297 10*3/uL (ref 150–400)
RBC: 4.04 MIL/uL (ref 3.87–5.11)
RDW: 14.1 % (ref 11.5–15.5)
WBC: 6.6 10*3/uL (ref 4.0–10.5)

## 2016-08-25 LAB — URINALYSIS, ROUTINE W REFLEX MICROSCOPIC
BILIRUBIN URINE: NEGATIVE
Glucose, UA: NEGATIVE mg/dL
Hgb urine dipstick: NEGATIVE
KETONES UR: NEGATIVE mg/dL
Nitrite: NEGATIVE
Protein, ur: NEGATIVE mg/dL
Specific Gravity, Urine: 1.011 (ref 1.005–1.030)
pH: 6 (ref 5.0–8.0)

## 2016-08-25 LAB — PROTEIN / CREATININE RATIO, URINE
Creatinine, Urine: 91 mg/dL
Protein Creatinine Ratio: 0.1 mg/mg{Cre} (ref 0.00–0.15)
Total Protein, Urine: 9 mg/dL

## 2016-08-25 MED ORDER — LACTATED RINGERS IV SOLN: INTRAVENOUS | Status: DC | PRN

## 2016-08-25 MED ORDER — ACETAMINOPHEN 500 MG PO TABS
1000.0000 mg | ORAL_TABLET | Freq: Once | ORAL | Status: AC
  Administered 2016-08-25: 1000 mg via ORAL
  Filled 2016-08-25: qty 2

## 2016-08-25 MED ORDER — AMLODIPINE BESYLATE 5 MG PO TABS: 5.0000 mg | ORAL_TABLET | Freq: Every day | ORAL | 0 refills | Status: DC

## 2016-08-25 MED ORDER — HYDRALAZINE HCL 20 MG/ML IJ SOLN: 10.0000 mg | Freq: Once | INTRAMUSCULAR | Status: DC | PRN

## 2016-08-25 MED ORDER — LABETALOL HCL 5 MG/ML IV SOLN
20.0000 mg | INTRAVENOUS | Status: DC | PRN
  Filled 2016-08-25: qty 4

## 2016-08-25 MED ORDER — AMLODIPINE BESYLATE 5 MG PO TABS
5.0000 mg | ORAL_TABLET | Freq: Once | ORAL | Status: AC
  Administered 2016-08-25: 5 mg via ORAL
  Filled 2016-08-25: qty 1

## 2016-08-25 NOTE — Discharge Instructions (Signed)
Pubic Symphysis Pain/Dysfunction ° °What is symphysis pubis dysfunction? ° °Symphysis pubis dysfunction (SPD) is a problem with the pelvis. Your pelvis is mainly formed of two pubic bones that curve round to make a cradle shape. The pubic bones meet at the front of your pelvis, at a firm joint called the symphysis pubis.  ° °The joint's connection is made strong by a dense network of tough tissues (ligaments). During pregnancy, swelling and pain can make the symphysis pubis joint less stable, causing SPD. ° °Doctors and physiotherapists classify any type of pelvic pain during pregnancy as pelvic girdle pain (PGP).  ° °SPD is one type of pelvic girdle pain. Diastasis symphysis pubis (DSP) is another type of pelvic girdle pain, which is related to SPD. DSP happens when the gap in the symphysis pubis joint widens too far. DSP is rare, and can only be diagnosed by an X-ray, ultrasound scan or MRI scan. What are the symptoms of SPD? Pain in the pubic area and groin are the most common symptoms, though you may also notice:  ° °Back pain, pain at the back of your pelvis or hip pain.  °Pain, along with a grinding or clicking sensation in your pubic area.  °Pain down the inside of your thighs or between your legs.  °Pain that's made worse by parting your legs, walking, going up or down stairs or moving around in bed.  °Pain that's worse at night and stops you from sleeping well. Getting up to go to the toilet in the middle of the night can be especially painful. ° °SPD can occur at any time during your pregnancy or after giving birth. You may notice it for the first time during the middle of your pregnancy. What causes SPD?During pregnancy, your body produces a hormone called relaxin, which softens your ligaments to help your baby pass through your pelvis. This means that the joints in your pelvis naturally become more lax.  ° °However, this flexibility doesn't necessarily cause the painful problems of SPD. Usually, your  nerves and muscles are able to adapt and compensate for the greater flexibility in your joints. This means your body should cope well with the changes to your posture as your baby grows. ° °SPD is thought to happen when your body doesn't adapt so well to the stretchier, looser ligaments caused by relaxin. SPD can be triggered by: ° °the joints in your pelvis moving unevenly  °changes to the way your muscles work to support your pelvic girdle joints  °one pelvic joint not working properly and causing knock-on pain in the other joints of your pelvis °These problems mean that your pelvis is not as stable as it should be, and this is what causes SPD. Physiotherapy is the best way to treat SPD, because it's about the relationship between your muscles and bones, rather than how lax your joints are. You're more likely to develop SPD if: ° °you had pelvic girdle pain or pelvic joint pain before you became pregnant  °you've had a previous injury to your pelvis  °you've had pelvic girdle pain in a previous pregnancy  °you have a high BMI and were overweight before you became pregnant  °hypermobility in all your joints  °How is SPD diagnosed? Your doctor or midwife should refer you to a women’s health physiotherapist. Your physiotherapist will test the stability, movement and pain in your pelvic joints and muscles. How is SPD treated?SPD is managed in the same way as other pelvic girdle pain. Treatment includes:  ° °  Exercises to strengthen your spinal, tummy, pelvic girdle, hip and pelvic floor muscles. These will improve the stability of your pelvis and back. You may need gentle, hands-on treatment of your hip, back or pelvis to correct stiffness or imbalance. Water gymnastics can sometimes help.  Your physiotherapist should advise you on how to make daily activities less painful and on how to make the birth of your baby easier. Your midwife should help you to write a birth plan that takes into account your SPD symptoms.    Acupuncture may help reduce the pain and is safe during pregnancy. Make sure your practitioner is trained and experienced in working with pregnant women.  Other manual therapies, such as osteopathy may help. See a registered practitioner who is experienced in treating pregnant women.  A pelvic support belt may give relief, particularly when you're exercising or active. What can I do to ease the pain of SPD?  Be as active as you can, but don't push yourself so far that it hurts.  Stick to the pelvic floor and tummy exercises that your physiotherapist recommends.  Ask for and accept offers of help with daily chores.  Plan ahead so that you reduce the activities that cause you problems. You could use a rucksack to carry things around, both indoors and out.  Take care to part your legs no further than your pain-free range, particularly when getting in and out of the car, bed or bath. If you are lying down, pull up your knees as far as you can to make it easier to part your legs. If you are sitting, try arching your back and sticking your chest out before parting or moving your legs.  Avoid activities that make your pain worse or that put your pelvis in an uneven position, such as sitting cross-legged or carrying your toddler on your hip. If something hurts, stop doing it. If the pain is allowed to flare up, it can take a long time to settle down again.  Try to sleep on your side with legs bent and a pillow between your knees.  Rest regularly or sit down for activities you would normally do standing, such as ironing. By sitting on a birth ball or by getting down on your hands and knees, you'll take the weight of your baby off your pelvis.  Try not to do heavy lifting or pushing. Pushing supermarket trolleys can often make your pain worse, so shop online or ask someone to shop for you.  When climbing stairs, take one step at a time. Step up onto one step with your best leg and then bring your other leg to  meet it. Repeat with each step.  Avoid standing on one leg. When getting dressed, sit down to pull on your knickers or trousers. Will I recover from SPD after Ive had my baby ? Youre very likely to recover within a few weeks to a few months after your baby is born. If you can, carry on with physiotherapy after the birth. Try to get help with looking after your baby during the early weeks.   You may find you get twinges every month just before your period is due. This is likely to be caused by hormones that have a similar effect to pregnancy hormones.   If you have SPD in one pregnancy, it is more likely that youll have it next time you get pregnant. Ask your midwife to refer you to a physiotherapist early on. SPD may not necessarily be  as bad next time if it is well managed from the start of pregnancy.   You could consider giving yourself a bit of time from one pregnancy to the next. Losing excess weight, getting fit and waiting until your children can walk may help reduce the symptoms of getting any type of pelvic pain next time. Where can I get help and support?[facility] can provide a list of physiotherapists in your area.  You can get in touch with other women in your situation by contacting [employer], a charity that offers support to women with pelvic girdle pain, including SPD, or by visiting our community.   More tips and advice:  Go to http://www.babycentre.co.uk/a546492/pelvic-pain-spd for more information  See our photo guide to pregnancy stretches, designed to help relieve those aches and pains.  Watch our video for tips on how to get comfortable in bed  Discover how SPD might affect your labour.  Last reviewed: July 2015  Next review: July 2018       Postpartum Hypertension Postpartum hypertension is high blood pressure after pregnancy that remains higher than normal for more than two days after delivery. You may not  realize that you have postpartum hypertension if your blood pressure is not being checked regularly. In some cases, postpartum hypertension will go away on its own, usually within a week of delivery. However, for some women, medical treatment is required to prevent serious complications, such as seizures or stroke. The following things can affect your blood pressure:  The type of delivery you had.  Having received IV fluids or other medicines during or after delivery.  What are the causes? Postpartum hypertension may be caused by any of the following or by a combination of any of the following:  Hypertension that existed before pregnancy (chronic hypertension).  Gestational hypertension.  Preeclampsia or eclampsia.  Receiving a lot of fluid through an IV during or after delivery.  Medicines.  HELLP syndrome.  Hyperthyroidism.  Stroke.  Other rare neurological or blood disorders.  In some cases, the cause may not be known. What increases the risk? Postpartum hypertension can be related to one or more risk factors, such as:  Chronic hypertension. In some cases, this may not have been diagnosed before pregnancy.  Obesity.  Type 2 diabetes.  Kidney disease.  Family history of preeclampsia.  Other medical conditions that cause hormonal imbalances.  What are the signs or symptoms? As with all types of hypertension, postpartum hypertension may not have any symptoms. Depending on how high your blood pressure is, you may experience:  Headaches. These may be mild, moderate, or severe. They may also be steady, constant, or sudden in onset (thunderclap headache).  Visual changes.  Dizziness.  Shortness of breath.  Swelling of your hands, feet, lower legs, or face. In some cases, you may have swelling in more than one of these locations.  Heart palpitations or a racing heartbeat.  Difficulty breathing while lying down.  Decreased urination.  Other rare signs and  symptoms may include:  Sweating more than usual. This lasts longer than a few days after delivery.  Chest pain.  Sudden dizziness when you get up from sitting or lying down.  Seizures.  Nausea or vomiting.  Abdominal pain.  How is this diagnosed? The diagnosis of postpartum hypertension is made through a combination of physical examination findings and testing of your blood and urine. You may also have additional tests, such as a CT scan or an MRI, to  check for other complications of postpartum hypertension. How is this treated? When blood pressure is high enough to require treatment, your options may include:  Medicines to reduce blood pressure (antihypertensives). Tell your health care provider if you are breastfeeding or if you plan to breastfeed. There are many antihypertensive medicines that are safe to take while breastfeeding.  Stopping medicines that may be causing hypertension.  Treating medical conditions that are causing hypertension.  Treating the complications of hypertension, such as seizures, stroke, or kidney problems.  Your health care provider will also continue to monitor your blood pressure closely and repeatedly until it is within a safe range for you. Follow these instructions at home:  Take medicines only as directed by your health care provider.  Get regular exercise after your health care provider tells you that it is safe.  Follow your health care providers recommendations on fluid and salt restrictions.  Do not use any tobacco products, including cigarettes, chewing tobacco, or electronic cigarettes. If you need help quitting, ask your health care provider.  Keep all follow-up visits as directed by your health care provider. This is important. Contact a health care provider if:  Your symptoms get worse.  You have new symptoms, such as: ? Headache. ? Dizziness. ? Visual changes. Get help right away if:  You develop a severe or sudden  headache.  You have seizures.  You develop numbness or weakness on one side of your body.  You have difficulty thinking, speaking, or swallowing.  You develop severe abdominal pain.  You develop difficulty breathing, chest pain, a racing heartbeat, or heart palpitations. These symptoms may represent a serious problem that is an emergency. Do not wait to see if the symptoms will go away. Get medical help right away. Call your local emergency services (911 in the [place] Do not drive yourself to the hospital. This information is not intended to replace advice given to you by your health care provider. Make sure you discuss any questions you have with your health care provider. Document Released: 10/06/2013 Document Revised: 07/08/2015 Document Reviewed: 08/17/2013 Elsevier Interactive Patient Education  [employer].

## 2016-08-25 NOTE — MAU Provider Note (Signed)
History     CSN: [id number]  Arrival date and time: 08/25/16 1000  First Provider Initiated Contact with Patient 08/25/16 1052      Chief Complaint  Patient presents with  . Hypertension  . Headache   HPI Virginia [name] is a 41 y.o. [id number] female who was sent from the office for evaluation of postpartum hypertension. Patient is 9 days s/o SVD. Patient denies any history of hypertension. BP in office was 159/102. Patient reports headache that started "I don't remember when". Frontal headache that is dull & rates 4/10. Has not treated headache. Denies visual disturbance, CP, SOB, or epigastric pain. Endorses pelvic pain which is the reason she went to her OB today. Reports hx of pubic symphysis pain that she had prior to delivery which has continued and worsened since her delivery. Rates pain 10/10. Has been taking ibuprofen & percocet without relief. Pain worse with position changes & walking. Denies abdominal pain, dysuria, vaginal bleeding, vaginal discharge, or fever/chills.   OB History    Gravida Para Term Preterm AB Living   4 3 3  0 1 3   SAB TAB Ectopic Multiple Live Births   1 0 0 0 3      Past Medical History:  Diagnosis Date  . JYNWGNFA(213.0)     Past Surgical History:  Procedure Laterality Date  . NO PAST SURGERIES      History reviewed. No pertinent family history.  Social History  Substance Use Topics  . Smoking status: Never Smoker  . Smokeless tobacco: Never Used  . Alcohol use No    Allergies: No Known Allergies  Prescriptions Prior to Admission  Medication Sig Dispense Refill Last Dose  . acetaminophen (TYLENOL) 325 MG tablet Take 650 mg by mouth every 6 (six) hours as needed for mild pain, moderate pain, fever or headache.   Not Taking  . benzocaine-Menthol (DERMOPLAST) 20-0.5 % AERO Apply 1 application topically as needed for irritation (perineal discomfort). 78 g 0   . ibuprofen (ADVIL,MOTRIN) 600 MG tablet Take 1 tablet (600 mg total)  by mouth every 6 (six) hours. 120 tablet 2 Taking  . omega-3 acid ethyl esters (LOVAZA) 1 g capsule Take 1 g by mouth at bedtime.   08/14/2016 at Unknown time  . oxyCODONE-acetaminophen (PERCOCET/ROXICET) 5-325 MG tablet Take 2 tablets by mouth every 4 (four) hours as needed for severe pain (Pain scale >7). 30 tablet 0 Taking  . Prenat-FeAsp-Meth-FA-DHA w/o A (PRENATE PIXIE) 10-0.6-0.4-200 MG CAPS Take 1 capsule by mouth at bedtime.   Taking  . senna-docusate (SENOKOT-S) 8.6-50 MG tablet Take 2 tablets by mouth at bedtime. 60 tablet 2 Taking    Review of Systems  Constitutional: Negative.   Eyes: Negative for visual disturbance.  Respiratory: Negative.   Cardiovascular: Negative.   Gastrointestinal: Negative.   Genitourinary: Positive for pelvic pain. Negative for dysuria, vaginal bleeding and vaginal discharge.  Neurological: Positive for headaches. Negative for dizziness.   Physical Exam   Blood pressure 122/71, pulse (!) 52, temperature 98.3 F (36.8 C), temperature source Oral, resp. rate 18, SpO2 100 %, currently breastfeeding.  Temp:  [98.3 F (36.8 C)] 98.3 F (36.8 C) (07/10 1020) Pulse Rate:  [52-72] 52 (07/10 1246) Resp:  [18] 18 (07/10 1326) BP: (120-166)/(66-102) 122/71 (07/10 1246) SpO2:  [100 %] 100 % (07/10 1326)  Physical Exam  Nursing note and vitals reviewed. Constitutional: She is oriented to person, place, and time. She appears well-developed and well-nourished. No distress.  HENT:  Head:  Normocephalic and atraumatic.  Eyes: Conjunctivae are normal. Right eye exhibits no discharge. Left eye exhibits no discharge. No scleral icterus.  Neck: Normal range of motion.  Cardiovascular: Normal rate, regular rhythm and normal heart sounds.   No murmur heard. Respiratory: Effort normal and breath sounds normal. No respiratory distress. She has no wheezes.  GI: Soft. Bowel sounds are normal. She exhibits no distension. There is no tenderness. There is no rebound and no  guarding.  Neurological: She is alert and oriented to person, place, and time. She has normal reflexes.  No clonus  Skin: Skin is warm and dry. She is not diaphoretic.  Psychiatric: She has a normal mood and affect. Her behavior is normal. Judgment and thought content normal.    MAU Course  Procedures Results for orders placed or performed during the hospital encounter of 08/25/16 (from the past 24 hour(s))  Protein / creatinine ratio, urine     Status: None   Collection Time: 08/25/16 11:00 AM  Result Value Ref Range   Creatinine, Urine 91.00 mg/dL   Total Protein, Urine 9 mg/dL   Protein Creatinine Ratio 0.10 0.00 - 0.15 mg/mg[Cre]  Urinalysis, Routine w reflex microscopic     Status: Abnormal   Collection Time: 08/25/16 11:00 AM  Result Value Ref Range   Color, Urine YELLOW YELLOW   APPearance CLEAR CLEAR   Specific Gravity, Urine 1.011 1.005 - 1.030   pH 6.0 5.0 - 8.0   Glucose, UA NEGATIVE NEGATIVE mg/dL   Hgb urine dipstick NEGATIVE NEGATIVE   Bilirubin Urine NEGATIVE NEGATIVE   Ketones, ur NEGATIVE NEGATIVE mg/dL   Protein, ur NEGATIVE NEGATIVE mg/dL   Nitrite NEGATIVE NEGATIVE   Leukocytes, UA TRACE (A) NEGATIVE   RBC / HPF 0-5 0 - 5 RBC/hpf   WBC, UA 0-5 0 - 5 WBC/hpf   Bacteria, UA RARE (A) NONE SEEN   Squamous Epithelial / LPF 0-5 (A) NONE SEEN   Mucous PRESENT   Comprehensive metabolic panel     Status: Abnormal   Collection Time: 08/25/16 11:11 AM  Result Value Ref Range   Sodium 137 135 - 145 mmol/L   Potassium 3.4 (L) 3.5 - 5.1 mmol/L   Chloride 105 101 - 111 mmol/L   CO2 24 22 - 32 mmol/L   Glucose, Bld 97 65 - 99 mg/dL   BUN 10 6 - 20 mg/dL   Creatinine, Ser 1.61 0.44 - 1.00 mg/dL   Calcium 8.6 (L) 8.9 - 10.3 mg/dL   Total Protein 7.7 6.5 - 8.1 g/dL   Albumin 3.7 3.5 - 5.0 g/dL   AST 28 15 - 41 U/L   ALT 72 (H) 14 - 54 U/L   Alkaline Phosphatase 88 38 - 126 U/L   Total Bilirubin 0.8 0.3 - 1.2 mg/dL   GFR calc non Af Amer >60 >60 mL/min   GFR calc  Af Amer >60 >60 mL/min   Anion gap 8 5 - 15  CBC     Status: None   Collection Time: 08/25/16 11:11 AM  Result Value Ref Range   WBC 6.6 4.0 - 10.5 K/uL   RBC 4.04 3.87 - 5.11 MIL/uL   Hemoglobin 12.9 12.0 - 15.0 g/dL   HCT 09.6 04.5 - 40.9 %   MCV 94.3 78.0 - 100.0 fL   MCH 31.9 26.0 - 34.0 pg   MCHC 33.9 30.0 - 36.0 g/dL   RDW 81.1 91.4 - 78.2 %   Platelets 297 150 - 400 K/uL  MDM IV labetalol protocol ordered for severe range BP -- pt had 4 severe range BPs which resolved after IV started & just prior to administration of labetalol -- labetalol withheld.  Tylenol 1 gm PO -- pt reports mild improvement in headache CBC, CMP, cath urine PCR S/w Dr. Adrian BlackwaterStinson. Reviewed BPs & labs. Will discharge home on PO norvasc & have patient f/u for BP check Patient given Norvasc 5 mg PO prior to discharge as she doesn't know if she'll be able to pick up meds today  Assessment and Plan  A: 1. Postpartum hypertension   2. Pain of female symphysis pubis    P: Discharge home Rx norvasc Schedule BP check in office on Friday -- msg sent to [facility] Discussed reasons to return to MAU including s/s preeclampsia  Virginia [name] 08/25/2016, 10:52 AM

## 2016-08-25 NOTE — Progress Notes (Signed)
Pt states that she is in a lot of pain after delivery. Pt states she can only sleep in supine or sitting position. Pt states it is difficult for her to move side to side in bed. Pt states that she previously had problems with varicose veins and that has resolved some since delivery.

## 2016-08-25 NOTE — MAU Note (Signed)
Sent from office, BP elevated. Vag del on 7/1. Having pelvic pain.  That is what she went in for.  Has a mild headache, denies visual changes,  Swelling has gone down, denies epigastric pain.

## 2016-08-25 NOTE — Progress Notes (Signed)
Patient ID: Virginia [name], female   DOB: November 13, 1975, 41 y.o.   MRN: [id number]  Chief Complaint  Patient presents with  . Postpartum Care    HPI Virginia [name] is a 41 y.o. female.  Here for pain in pubic area since delivery.  States that she has problems walking. Noted to have elevated blood pressure today, no prior hx.  Does report HA about a 4 on a scale of 1-10.  Is breast feeding and doing well with that.  Does report bilateral leg swelling.  B/P reviewed with Dr. Macon Large.    HPI  Past Medical History:  Diagnosis Date  . WUJWJXBJ(478.2)     Past Surgical History:  Procedure Laterality Date  . NO PAST SURGERIES      No family history on file.  Social History Social History  Substance Use Topics  . Smoking status: Never Smoker  . Smokeless tobacco: Never Used  . Alcohol use No    No Known Allergies  Current Outpatient Prescriptions  Medication Sig Dispense Refill  . ibuprofen (ADVIL,MOTRIN) 600 MG tablet Take 1 tablet (600 mg total) by mouth every 6 (six) hours. 120 tablet 2  . oxyCODONE-acetaminophen (PERCOCET/ROXICET) 5-325 MG tablet Take 2 tablets by mouth every 4 (four) hours as needed for severe pain (Pain scale >7). 30 tablet 0  . Prenat-FeAsp-Meth-FA-DHA w/o A (PRENATE PIXIE) 10-0.6-0.4-200 MG CAPS Take 1 capsule by mouth at bedtime.    . senna-docusate (SENOKOT-S) 8.6-50 MG tablet Take 2 tablets by mouth at bedtime. (Patient not taking: Reported on 08/25/2016) 60 tablet 2  . amLODipine (NORVASC) 5 MG tablet Take 1 tablet (5 mg total) by mouth daily. 30 tablet 0  . benzocaine-Menthol (DERMOPLAST) 20-0.5 % AERO Apply 1 application topically as needed for irritation (perineal discomfort). (Patient not taking: Reported on 08/25/2016) 78 g 0   No current facility-administered medications for this visit.    Facility-Administered Medications Ordered in Other Visits  Medication Dose Route Frequency Provider Last Rate Last Dose  . hydrALAZINE  (APRESOLINE) injection 10 mg  10 mg Intravenous Once PRN Judeth Horn, NP      . labetalol (NORMODYNE,TRANDATE) injection 20-80 mg  20-80 mg Intravenous Q10 min PRN Judeth Horn, NP      . lactated ringers infusion   Intravenous PRN Judeth Horn, NP        Review of Systems Review of Systems Constitutional: negative for fatigue and weight loss Respiratory: negative for cough and wheezing Cardiovascular: negative for chest pain, fatigue and palpitations Gastrointestinal: negative for abdominal pain and change in bowel habits Genitourinary:negative Integument/breast: negative for nipple discharge Musculoskeletal:negative for myalgias Neurological: negative for gait problems and tremors Behavioral/Psych: negative for abusive relationship, depression Endocrine: negative for temperature intolerance      Blood pressure (!) 159/102, currently breastfeeding.  Physical Exam Physical Exam General:   alert  Skin:   no rash or abnormalities  Lungs:   clear to auscultation bilaterally  Heart:   regular rate and rhythm, S1, S2 normal, no murmur, click, rub or gallop  Breasts:  deferred  LE:   +2 pitting edema  Abdomen:  normal findings: no organomegaly, soft, non-tender and no hernia Lower pelvis: tender to palpation    50% of 15 min visit spent on counseling and coordination of care.    Data Reviewed Previous medical hx, meds  Assessment       Breast feeding female  Elevated blood pressures in the postpartum period  Pelvic pain r/t recent vaginal delivery  Plan   Report called to MAU   PT referral placed   Orders Placed This Encounter  Procedures  . AMB referral to rehabilitation    Referral Priority:   Urgent    Referral Type:   Consultation    Number of Visits Requested:   6       Follow up as needed.

## 2016-08-28 ENCOUNTER — Ambulatory Visit: Payer: Medicaid Other

## 2016-08-28 VITALS — BP 135/84 | HR 81

## 2016-08-28 DIAGNOSIS — Z0130 Encounter for examination of blood pressure without abnormal findings: Secondary | ICD-10-CM

## 2016-08-28 NOTE — Progress Notes (Signed)
Pt here for a Bp check. Pt bp today is 135/84 pulse 81. Reviewed results with Rachelle. Pt will return for PP.

## 2016-09-02 ENCOUNTER — Encounter: Payer: Self-pay | Admitting: Physical Therapy

## 2016-09-02 ENCOUNTER — Ambulatory Visit: Payer: Medicaid Other | Attending: Certified Nurse Midwife | Admitting: Physical Therapy

## 2016-09-02 DIAGNOSIS — R262 Difficulty in walking, not elsewhere classified: Secondary | ICD-10-CM | POA: Diagnosis not present

## 2016-09-02 DIAGNOSIS — R293 Abnormal posture: Secondary | ICD-10-CM | POA: Insufficient documentation

## 2016-09-02 DIAGNOSIS — M6281 Muscle weakness (generalized): Secondary | ICD-10-CM | POA: Diagnosis present

## 2016-09-02 NOTE — Patient Instructions (Signed)
Back Hyperextension: Using Arms    Lying face down with arms bent, inhale. Then while exhaling, straighten arms. Hold __5__ seconds. Slowly return to starting position. Repeat ___10_ times per set. Do ___1_ sets per session. Do __1__ sessions per day.  Copyright  VHI. All rights reserved.    YouTube: [employer] - Pelvic floor relaxation meditation  Internet resources:  Pelvic floor based information: [employer] - YouTube Pelvic floor relaxation meditation Shelly Prosko - YouTube Vaginismus.com (tools like dilators and blog articles) ForgetParking.dkCMTmedical.com (tools like dilators and blog articles) Multisystem.com Juliewiebept.com Learnwithdianelee.com    Books:  Heal Pelvic Pain: The Proven Stretching, Strengthening, and Nutrition Program for Relieving Pain, Incontinence, I.B.S, and Other Symptoms Without Surgery by Pasty SpillersAmy Stein  Pelvic Pain Explained: What you need to know by Judeth CornfieldStephanie A. Cardell PeachPrendergast, Elizabeth H. Akincilar  Sex without pain: A self-treatment guide to the sex life you deserve by Saverio DankerHeather Jeffcoat , DPT A Headache in the Pelvis, a New, Revised, Expanded and Updated 6th Edition: A New Understanding and Treatment for Chronic Pelvic Pain Syndromes by Rosalio Loudavid Wise, Valli Glanceodney Anderson  Pain relief tools:  At home TENS units - Amazon TENS 7000

## 2016-09-02 NOTE — Therapy (Addendum)
[facility] [address], [place], [place], [address] Phone: [phone]   Fax:  [phone]  Physical Therapy Evaluation  Patient Details  Name: Virginia [name] MRN: [id number] Date of Birth: [birth year] Referring Provider: Morene Crocker, CNM  Encounter Date: 09/02/2016      PT End of Session - 09/02/16 1724    Visit Number 1   Date for PT Re-Evaluation 12/23/16   Authorization Type medicaid 1 visit; then self pay   PT Start Time 3016   PT Stop Time 1232   PT Time Calculation (min) 47 min   Activity Tolerance Patient limited by pain      Past Medical History:  Diagnosis Date  . WFUXNATF(573.2)     Past Surgical History:  Procedure Laterality Date  . NO PAST SURGERIES      There were no vitals filed for this visit.       Subjective Assessment - 09/02/16 1148    Subjective The middle of the pregnancy, my pelvic floor started to hurts, the pain never went away.  They told me it was because of vericose veins but that has cleared up and I still have a lot of pain.  Pt states she can only sleep in semi-reclined position.  Walking hurts and can't drive.  She is having difficulty caring for her new born baby.   Patient is accompained by: Family member   Pertinent History cervix did not dilate during pregnancy, multiple vaginal deliveries (4)   Limitations Sitting;Standing;Walking;House hold activities   Patient Stated Goals to be able to walk without holding on and not to have the pain   Currently in Pain? Yes   Pain Score 10-Worst pain ever  "usually it is 13"   Pain Location Pelvis   Pain Orientation Lower   Pain Descriptors / Indicators Sore   Pain Type Acute pain   Pain Onset More than a month ago   Pain Frequency Constant   Aggravating Factors  rolling onto the side, any movement   Pain Relieving Factors pain medicine, heat   Effect of Pain on Daily Activities taking care of baby    Multiple Pain Sites No            [facility] PT Assessment - 09/02/16 0001      Assessment   Medical Diagnosis M62.89 (ICD-10-CM) - Pelvic floor dysfunction in female   Referring Provider Morene Crocker, CNM   Onset Date/Surgical Date 08/16/16  delivery date   Prior Therapy No     Precautions   Precautions None     Restrictions   Weight Bearing Restrictions No     Balance Screen   Has the patient fallen in the past 6 months No     [place] residence   Living Arrangements Spouse/significant other;Children     Prior Function   Level of Independence Independent with basic ADLs;Needs assistance with homemaking   Child Care Moderate   Vocation Other (comment)   Vocation Requirements homemaker     Cognition   Overall Cognitive Status Within Functional Limits for tasks assessed     Observation/Other Assessments   Focus on Therapeutic Outcomes (FOTO)  92% limited     Observation/Other Assessments-Edema    Edema --  swelling of bilateral LE     Posture/Postural Control   Posture/Postural Control Postural limitations   Postural Limitations Anterior pelvic tilt;Flexed trunk     ROM / Strength  AROM / PROM / Strength AROM;Strength     AROM   Overall AROM Comments lumbar unable to extend, 50% limited flexion     Strength   Overall Strength Comments bilateral hip strength 4-/5     Palpation   Palpation comment tightness bilateral adductors     Ambulation/Gait   Gait Pattern Right hip hike;Left hip hike;Trunk flexed            Objective measurements completed on examination: See above findings.        Pelvic Floor Special Questions - 09/02/16 0001    Prior Pelvic/Prostate Exam Yes   Are you Pregnant or attempting pregnancy? No   Prior Pregnancies Yes   Number of Pregnancies 4   Number of Vaginal Deliveries 4   Any difficulty with labor and deliveries Yes   Diastasis Recti no   Currently Sexually Active No    Marinoff Scale pain prevents any attempts at intercourse   Urinary Leakage Yes   How often only coughing and sneezing   Pad use pads for discharge from recent delivery   Activities that cause leaking Coughing;Sneezing   Urinary urgency No   Urinary frequency No   Fecal incontinence No   Falling out feeling (prolapse) --  not sure   Skin Integrity Intact   Perineal Body/Introitus  Descended   External Palpation right ischiocavernosis and transverse peroneus tender and tight   Prolapse None   Pelvic Floor Internal Exam pt informed and consent given   Exam Type Vaginal   Sensation normal   Palpation very tender and pt recently had stiches so only palpatedbulbospongeosis   Strength --  unable to test   Tone high          [facility] Adult PT Treatment/Exercise - 09/02/16 0001      Self-Care   Self-Care Other Self-Care Comments   Other Self-Care Comments  handout for outside resourses     Exercises   Exercises Other Exercises   Other Exercises  mini press ups                PT Education - 09/02/16 1755    Education provided Yes   Education Details outside resources and prone press up   Person(s) Educated Patient   Methods Explanation;Handout   Comprehension Verbalized understanding          PT Short Term Goals - 09/02/16 1744      PT SHORT TERM GOAL #1   Title understands improved posture and able to demonstrate   Time 4   Period Weeks   Status New     PT SHORT TERM GOAL #2   Title reduced pain to 8/10 on average   Baseline 10/10   Time 4   Period Weeks   Status New     PT SHORT TERM GOAL #3   Title able to walk with improved upright posture with LRAD   Baseline about 30 deg trunk flexion   Time 4   Period Weeks   Status New     PT SHORT TERM GOAL #4   Title able to tolerate internal soft tissue release   Time 4   Period Weeks   Status New           PT Long Term Goals - 09/02/16 1746      PT LONG TERM GOAL #1   Title able to ambulate  without AD for short distances with upright posture   Time 16   Period Weeks  Status New     PT LONG TERM GOAL #2   Title pain reduced to 6/10 at most in sitting, walking, and standing   Time 16   Period Weeks   Status New     PT LONG TERM GOAL #3   Title FOTO < or = to 75% limited for significant improvement in function   Time 16   Period Weeks   Status New     PT LONG TERM GOAL #4   Title Pt able to lift and carry her baby for at least 5 minutes at a time for abilty to provide care for her infant    Time 16   Period Weeks   Status New                Plan - 09/02/16 1739    Clinical Impression Statement Patient presents to clinic after delivery her baby 2 weeks ago. Pt is in extreme 10/10 pain and states she is currently on pain med.  Pt has posture and gait deficits and with reduced AROM of lumbar spine and bilateral hip weakness.  She has muscle spasms in pelvic floor and hip adductors.  Pt needed to sit semi-reclined and unable to get into side lying positions or perform bed mobilty.  Pt unable to walk without support of baby stroller. Pt unable to perform pelvic floor contraction due to sensative tissue.  Pt has edema bilateral LE.  Skilled PT needed to improved mobillity and return to functional activities by addressing these impairments and any other impairments found as patient is able to demonstrate more once pain is managed.   History and Personal Factors relevant to plan of care: multiple vaginal deliveries   Clinical Presentation Evolving   Clinical Presentation due to: nothing able to manage pain, 10/10 with pain medicine   Clinical Decision Making Moderate   Rehab Potential Good   PT Frequency 2x / week  reducing to 1x at 4-6 weeks   PT Duration Other (comment)  4 months   PT Treatment/Interventions ADLs/Self Care Home Management;Biofeedback;Cryotherapy;English as a second language teacher;Therapeutic  activities;Therapeutic exercise;Balance training;Neuromuscular re-education;Patient/family education;Manual techniques;Passive range of motion;Dry needling;Taping;Vasopneumatic Device;Scar mobilization   PT Next Visit Plan posture, lumbar mobility, stretches, gentle core and breathing exercises, cryotherapy, manual to LE, abdomen, pelvic floor as tolerated   Recommended Other Services n/a   Consulted and Agree with Plan of Care Patient      Patient will benefit from skilled therapeutic intervention in order to improve the following deficits and impairments:  Abnormal gait, Decreased endurance, Difficulty walking, Decreased range of motion, Postural dysfunction, Pain, Increased fascial restricitons, Increased muscle spasms, Increased edema  Visit Diagnosis: Difficulty in walking, not elsewhere classified  Muscle weakness (generalized)  Abnormal posture     Problem List Patient Active Problem List   Diagnosis Date Noted  . Normal labor 08/15/2016  . Supervision of high-risk pregnancy 02/26/2016  . AMA (advanced maternal age) multigravida 35+ 02/26/2016  . Late prenatal care affecting pregnancy in second trimester 02/26/2016    Zannie Cove, PT 09/02/2016, 6:07 PM  [facility] [address] [place], [place], [address] Phone: [phone]   Fax:  [phone]  Name: Karrine Kluttz MRN: [id number] Date of Birth: 27-Aug-1975   PHYSICAL THERAPY DISCHARGE SUMMARY  Visits from Start of Care: 1  Current functional level related to goals / functional outcomes: One visit only   Remaining deficits: One visit only   Education / Equipment: HEP  Plan: Patient agrees to discharge.  Patient goals were not met. Patient is being discharged due to not returning since the last visit.  ?????         Zannie Cove, PT 12/31/16 12:33 PM

## 2016-09-16 ENCOUNTER — Ambulatory Visit (INDEPENDENT_AMBULATORY_CARE_PROVIDER_SITE_OTHER): Payer: Medicaid Other | Admitting: Obstetrics & Gynecology

## 2016-09-16 ENCOUNTER — Encounter: Payer: Self-pay | Admitting: Obstetrics & Gynecology

## 2016-09-16 VITALS — BP 134/87 | HR 84 | Wt 198.5 lb

## 2016-09-16 DIAGNOSIS — S334XXD Traumatic rupture of symphysis pubis, subsequent encounter: Secondary | ICD-10-CM

## 2016-09-16 MED ORDER — TRAMADOL HCL 50 MG PO TABS: 50.0000 mg | ORAL_TABLET | Freq: Four times a day (QID) | ORAL | 0 refills | Status: DC | PRN

## 2016-09-16 NOTE — Progress Notes (Signed)
Post Partum Exam  Virginia [name] is a 41 y.o. [phone] female who presents for a postpartum visit. She is 4 weeks postpartum following a spontaneous vaginal delivery. I have fully reviewed the prenatal and intrapartum course. The delivery was at 2924w6d gestational weeks.  Anesthesia: epidural. Postpartum course has been unremarkable. Baby's course has been unremarkable. Baby is feeding by breast. Bleeding spotting.. Bowel function is normal with Colace.. Bladder function is normal. Patient is not sexually active. Contraception method is none. Postpartum depression screening:neg  The following portions of the patient's history were reviewed and updated as appropriate: allergies, current medications, past family history, past medical history, past social history, past surgical history and problem list.  Review of Systems symphisis pubis syndrome    Objective:  currently breastfeeding.  General:  alert, cooperative and no distress   Breasts:  inspection negative, no nipple discharge or bleeding, no masses or nodularity palpable        Abdomen: soft, non-tender; bowel sounds normal; no masses,  no organomegaly   Vulva:  not evaluated                       Assessment:    1 month postpartum exam. Pap smear NORMAL 02/26/16.  Plan:   1. Contraception: condoms 2. Chiropractic for SP pain 3. Follow up as needed.   Adam PhenixArnold, Nelwyn Hebdon G, MD

## 2016-09-16 NOTE — Patient Instructions (Signed)
Pelvic Pain, Female °Pelvic pain is pain in your lower belly (abdomen), below your belly button and between your hips. The pain may start suddenly (acute), keep coming back (recurring), or last a long time (chronic). Pelvic pain that lasts longer than six months is considered chronic. There are many causes of pelvic pain. Sometimes the cause of your pelvic pain is not known. °Follow these instructions at home: °· Take over-the-counter and prescription medicines only as told by your doctor. °· Rest as told by your doctor. °· Do not have sex it if hurts. °· Keep a journal of your pelvic pain. Write down: °? When the pain started. °? Where the pain is located. °? What seems to make the pain better or worse, such as food or your menstrual cycle. °? Any symptoms you have along with the pain. °· Keep all follow-up visits as told by your doctor. This is important. °Contact a doctor if: °· Medicine does not help your pain. °· Your pain comes back. °· You have new symptoms. °· You have unusual vaginal discharge or bleeding. °· You have a fever or chills. °· You are having a hard time pooping (constipation). °· You have blood in your pee (urine) or poop (stool). °· Your pee smells bad. °· You feel weak or lightheaded. °Get help right away if: °· You have sudden pain that is very bad. °· Your pain continues to get worse. °· You have very bad pain and also have any of the following symptoms: °? A fever. °? Feeling stick to your stomach (nausea). °? Throwing up (vomiting). °? Being very sweaty. °· You pass out (lose consciousness). °This information is not intended to replace advice given to you by your health care provider. Make sure you discuss any questions you have with your health care provider. °Document Released: 07/22/2007 Document Revised: 02/27/2015 Document Reviewed: 11/23/2014 °Elsevier Interactive Patient Education © 2018 Elsevier Inc. ° °

## 2016-09-26 ENCOUNTER — Other Ambulatory Visit: Payer: Self-pay | Admitting: Student

## 2016-09-30 ENCOUNTER — Encounter: Payer: Self-pay | Admitting: Certified Nurse Midwife

## 2016-09-30 ENCOUNTER — Ambulatory Visit (INDEPENDENT_AMBULATORY_CARE_PROVIDER_SITE_OTHER): Payer: Medicaid Other | Admitting: Certified Nurse Midwife

## 2016-09-30 DIAGNOSIS — S334XXD Traumatic rupture of symphysis pubis, subsequent encounter: Secondary | ICD-10-CM

## 2016-09-30 DIAGNOSIS — I10 Essential (primary) hypertension: Secondary | ICD-10-CM | POA: Diagnosis not present

## 2016-09-30 DIAGNOSIS — Z3009 Encounter for other general counseling and advice on contraception: Secondary | ICD-10-CM | POA: Diagnosis not present

## 2016-09-30 MED ORDER — IBUPROFEN 800 MG PO TABS: 800.0000 mg | ORAL_TABLET | Freq: Three times a day (TID) | ORAL | 1 refills | Status: DC | PRN

## 2016-09-30 MED ORDER — TRAMADOL HCL 50 MG PO TABS: 50.0000 mg | ORAL_TABLET | Freq: Four times a day (QID) | ORAL | 2 refills | Status: DC | PRN

## 2016-09-30 MED ORDER — TRIAMTERENE-HCTZ 50-25 MG PO CAPS: 1.0000 | ORAL_CAPSULE | ORAL | 12 refills | Status: DC

## 2016-10-01 ENCOUNTER — Encounter: Payer: Self-pay | Admitting: Certified Nurse Midwife

## 2016-10-01 DIAGNOSIS — S334XXA Traumatic rupture of symphysis pubis, initial encounter: Secondary | ICD-10-CM | POA: Insufficient documentation

## 2016-10-01 NOTE — Progress Notes (Signed)
Patient ID: Virginia [name], female   DOB: 1975-08-10, 41 y.o.   MRN: [id number]  Chief Complaint  Patient presents with  . Contraception    HPI Virginia [name] is a 41 y.o. female.  Here for f/u on blood pressure, contraception and pubic symphysis dysfunction.  States that she desires future fertility.  Discussed waiting until the symphysis pubis is healed and blood pressure is stable/controlled.  Asked if taking her medications, not sure that she really is.  Blood pressure recheck scheduled for 2 weeks.  Started on HCTZ along with her norvasc 5 mg.  Is doing well breast feeding.  No depression.  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  Is doing better with ambulation.  Still not able to do much exercise.  Declines birth control today.  Encouraged to keep taking PNV.  Really wants another boy.    HPI  Past Medical History:  Diagnosis Date  . [id number](478.2)     Past Surgical History:  Procedure Laterality Date  . NO PAST SURGERIES      History reviewed. No pertinent family history.  Social History Social History  Substance Use Topics  . Smoking status: Never Smoker  . Smokeless tobacco: Never Used  . Alcohol use No    No Known Allergies  Current Outpatient Prescriptions  Medication Sig Dispense Refill  . amLODipine (NORVASC) 5 MG tablet TAKE 1 TABLET BY MOUTH EVERY DAY 30 tablet 0  . Docusate Sodium (COLACE PO) Take by mouth.    . Prenat-FeAsp-Meth-FA-DHA w/o A (PRENATE PIXIE) 10-0.6-0.4-200 MG CAPS Take 1 capsule by mouth at bedtime.    . senna-docusate (SENOKOT-S) 8.6-50 MG tablet Take 2 tablets by mouth at bedtime. 60 tablet 2  . traMADol (ULTRAM) 50 MG tablet Take 1 tablet (50 mg total) by mouth every 6 (six) hours as needed. 45 tablet 2  . benzocaine-Menthol (DERMOPLAST) 20-0.5 % AERO Apply 1 application topically as needed for irritation (perineal discomfort). (Patient not taking: Reported on 08/25/2016) 78 g 0  . ibuprofen  (ADVIL,MOTRIN) 800 MG tablet Take 1 tablet (800 mg total) by mouth every 8 (eight) hours as needed. 60 tablet 1  . oxyCODONE-acetaminophen (PERCOCET/ROXICET) 5-325 MG tablet Take 2 tablets by mouth every 4 (four) hours as needed for severe pain (Pain scale >7). (Patient not taking: Reported on 09/30/2016) 30 tablet 0  . triamterene-hydrochlorothiazide (DYAZIDE) 50-25 MG capsule Take 1 capsule by mouth every morning. 30 capsule 12   No current facility-administered medications for this visit.     Review of Systems Review of Systems Constitutional: negative for fatigue and weight loss Respiratory: negative for cough and wheezing Cardiovascular: negative for chest pain, fatigue and palpitations Gastrointestinal: negative for abdominal pain and change in bowel habits Genitourinary:negative Integument/breast: negative for nipple discharge Musculoskeletal:+ for myalgias, symphysis pubis pain Neurological: negative for gait problems and tremors Behavioral/Psych: negative for abusive relationship, depression Endocrine: negative for temperature intolerance      Blood pressure (!) 155/98, pulse 67, height 5\' 6"  (1.676 m), weight 198 lb (89.8 kg), currently breastfeeding.  Physical Exam Physical Exam General:   alert  PE: deferred  100% of 20 min visit spent on counseling and coordination of care.    Data Reviewed Previous medical hx, meds, labs  Assessment     Hypertension Breast feeding status Natural family planning methods Symphysis pubis dysfunction r/t recent pregnancy    Plan    No orders of the defined types were placed in this encounter.  Meds ordered  this encounter  Medications  . traMADol (ULTRAM) 50 MG tablet    Sig: Take 1 tablet (50 mg total) by mouth every 6 (six) hours as needed.    Dispense:  45 tablet    Refill:  2  . ibuprofen (ADVIL,MOTRIN) 800 MG tablet    Sig: Take 1 tablet (800 mg total) by mouth every 8 (eight) hours as needed.    Dispense:  60 tablet     Refill:  1  . triamterene-hydrochlorothiazide (DYAZIDE) 50-25 MG capsule    Sig: Take 1 capsule by mouth every morning.    Dispense:  30 capsule    Refill:  12     Follow up as needed.  Annual exam in January.

## 2016-10-06 ENCOUNTER — Other Ambulatory Visit: Payer: Self-pay | Admitting: Certified Nurse Midwife

## 2016-10-06 DIAGNOSIS — I10 Essential (primary) hypertension: Secondary | ICD-10-CM

## 2016-10-06 MED ORDER — HYDROCHLOROTHIAZIDE 25 MG PO TABS: 25.0000 mg | ORAL_TABLET | Freq: Every day | ORAL | 5 refills | Status: DC

## 2016-10-08 ENCOUNTER — Encounter: Payer: Self-pay | Admitting: Certified Nurse Midwife

## 2016-10-08 ENCOUNTER — Ambulatory Visit (INDEPENDENT_AMBULATORY_CARE_PROVIDER_SITE_OTHER): Payer: Medicaid Other | Admitting: Certified Nurse Midwife

## 2016-10-08 VITALS — BP 139/88 | HR 57 | Wt 198.3 lb

## 2016-10-08 DIAGNOSIS — I10 Essential (primary) hypertension: Secondary | ICD-10-CM

## 2016-10-08 DIAGNOSIS — S334XXD Traumatic rupture of symphysis pubis, subsequent encounter: Secondary | ICD-10-CM

## 2016-10-08 MED ORDER — AMLODIPINE BESYLATE 5 MG PO TABS: 5.0000 mg | ORAL_TABLET | Freq: Every day | ORAL | 5 refills | Status: DC

## 2016-10-08 NOTE — Progress Notes (Signed)
Patient ID: Virginia [name], female   DOB: [birth year], 41 y.o.   MRN: [id number]  Chief Complaint  Patient presents with  . Blood Pressure Check    HPI Virginia [name] is a 40 y.o. female.  Here for f/u on elevated blood pressure.  Has recently started taking classes, and is having a hard time with the pain.  Did not sleep much last night.  Did take her blood pressure medications this morning. 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  Does desire to try a steroid injection if indicated to help with the pain.    HPI  Past Medical History:  Diagnosis Date  . [id number](206.0)     Past Surgical History:  Procedure Laterality Date  . NO PAST SURGERIES      History reviewed. No pertinent family history.  Social History Social History  Substance Use Topics  . Smoking status: Never Smoker  . Smokeless tobacco: Never Used  . Alcohol use No    No Known Allergies  Current Outpatient Prescriptions  Medication Sig Dispense Refill  . amLODipine (NORVASC) 5 MG tablet Take 1 tablet (5 mg total) by mouth daily. 30 tablet 5  . benzocaine-Menthol (DERMOPLAST) 20-0.5 % AERO Apply 1 application topically as needed for irritation (perineal discomfort). 78 g 0  . Docusate Sodium (COLACE PO) Take by mouth.    . hydrochlorothiazide (HYDRODIURIL) 25 MG tablet Take 1 tablet (25 mg total) by mouth daily. 30 tablet 5  . ibuprofen (ADVIL,MOTRIN) 800 MG tablet Take 1 tablet (800 mg total) by mouth every 8 (eight) hours as needed. 60 tablet 1  . oxyCODONE-acetaminophen (PERCOCET/ROXICET) 5-325 MG tablet Take 2 tablets by mouth every 4 (four) hours as needed for severe pain (Pain scale >7). 30 tablet 0  . Prenat-FeAsp-Meth-FA-DHA w/o A (PRENATE PIXIE) 10-0.6-0.4-200 MG CAPS Take 1 capsule by mouth at bedtime.    . senna-docusate (SENOKOT-S) 8.6-50 MG tablet Take 2 tablets by mouth at bedtime. 60 tablet 2  . traMADol (ULTRAM) 50 MG tablet Take 1 tablet (50 mg total) by mouth every 6 (six) hours as needed.  45 tablet 2   No current facility-administered medications for this visit.     Review of Systems Review of Systems Constitutional: negative for fatigue and weight loss Respiratory: negative for cough and wheezing Cardiovascular: negative for chest pain, fatigue and palpitations Gastrointestinal: negative for abdominal pain and change in bowel habits Genitourinary:negative Integument/breast: negative for nipple discharge Musculoskeletal:negative for myalgias Neurological: negative for gait problems and tremors Behavioral/Psych: negative for abusive relationship, depression Endocrine: negative for temperature intolerance      Blood pressure 139/88, pulse (!) 57, weight 198 lb 4.8 oz (89.9 kg), currently breastfeeding.  Physical Exam Physical Exam General:   alert  Deferred   100% of 15 min visit spent on counseling and coordination of care.    Data Reviewed Previous medical hx, meds  Assessment     Hypertension SPD: Symphysis pubis dysfunction    Plan    Orders Placed This Encounter  Procedures  . Ambulatory referral to Sports Medicine    Referral Priority:   Urgent    Referral Type:   Consultation    Number of Visits Requested:   1   Meds ordered this encounter  Medications  . amLODipine (NORVASC) 5 MG tablet    Sig: Take 1 tablet (5 mg total) by mouth daily.    Dispense:  30 tablet    Refill:  5    Follow up as needed.

## 2016-10-16 ENCOUNTER — Ambulatory Visit: Payer: Medicaid Other | Admitting: Physical Therapy

## 2017-06-22 ENCOUNTER — Ambulatory Visit: Payer: Self-pay | Admitting: Certified Nurse Midwife

## 2017-06-24 ENCOUNTER — Ambulatory Visit (INDEPENDENT_AMBULATORY_CARE_PROVIDER_SITE_OTHER): Payer: Medicaid Other | Admitting: Certified Nurse Midwife

## 2017-06-24 ENCOUNTER — Encounter: Payer: Self-pay | Admitting: Certified Nurse Midwife

## 2017-06-24 ENCOUNTER — Ambulatory Visit (INDEPENDENT_AMBULATORY_CARE_PROVIDER_SITE_OTHER): Payer: Medicaid Other

## 2017-06-24 VITALS — BP 143/90 | HR 69 | Wt 192.0 lb

## 2017-06-24 DIAGNOSIS — Z3201 Encounter for pregnancy test, result positive: Secondary | ICD-10-CM

## 2017-06-24 DIAGNOSIS — N912 Amenorrhea, unspecified: Secondary | ICD-10-CM

## 2017-06-24 LAB — POCT URINE PREGNANCY: PREG TEST UR: POSITIVE — AB

## 2017-06-24 NOTE — Progress Notes (Signed)
Elevated blood pressure today.  ?HTN.  No previous hx.  No exam today.

## 2017-06-24 NOTE — Progress Notes (Signed)
Pt here for a pregnancy test. Test today is positive. Pt states that her LMP was early March to mid March, but not sure of the date.

## 2017-06-24 NOTE — Progress Notes (Signed)
No exam.  Pregnancy confirmed.  Scheduled for NOB visit.

## 2017-06-28 ENCOUNTER — Encounter (HOSPITAL_COMMUNITY): Payer: Self-pay | Admitting: *Deleted

## 2017-06-28 ENCOUNTER — Inpatient Hospital Stay (HOSPITAL_COMMUNITY)
Admission: AD | Admit: 2017-06-28 | Discharge: 2017-06-28 | Disposition: A | Discharge status: Home / Self Care | Payer: Medicaid Other | Source: Ambulatory Visit | Attending: Obstetrics and Gynecology | Admitting: Obstetrics and Gynecology

## 2017-06-28 ENCOUNTER — Inpatient Hospital Stay (HOSPITAL_COMMUNITY): Payer: Medicaid Other

## 2017-06-28 DIAGNOSIS — O209 Hemorrhage in early pregnancy, unspecified: Secondary | ICD-10-CM | POA: Diagnosis not present

## 2017-06-28 DIAGNOSIS — Z3A08 8 weeks gestation of pregnancy: Secondary | ICD-10-CM | POA: Diagnosis not present

## 2017-06-28 DIAGNOSIS — O200 Threatened abortion: Secondary | ICD-10-CM | POA: Insufficient documentation

## 2017-06-28 DIAGNOSIS — R109 Unspecified abdominal pain: Secondary | ICD-10-CM | POA: Diagnosis present

## 2017-06-28 LAB — URINALYSIS, ROUTINE W REFLEX MICROSCOPIC
BILIRUBIN URINE: NEGATIVE
Glucose, UA: NEGATIVE mg/dL
HGB URINE DIPSTICK: NEGATIVE
Ketones, ur: NEGATIVE mg/dL
LEUKOCYTES UA: NEGATIVE
Nitrite: NEGATIVE
Protein, ur: NEGATIVE mg/dL
Specific Gravity, Urine: 1.005 (ref 1.005–1.030)
pH: 7 (ref 5.0–8.0)

## 2017-06-28 LAB — CBC
HEMATOCRIT: 39 % (ref 36.0–46.0)
HEMOGLOBIN: 13 g/dL (ref 12.0–15.0)
MCH: 31.5 pg (ref 26.0–34.0)
MCHC: 33.3 g/dL (ref 30.0–36.0)
MCV: 94.4 fL (ref 78.0–100.0)
Platelets: 239 10*3/uL (ref 150–400)
RBC: 4.13 MIL/uL (ref 3.87–5.11)
RDW: 14 % (ref 11.5–15.5)
WBC: 5.6 10*3/uL (ref 4.0–10.5)

## 2017-06-28 LAB — HCG, QUANTITATIVE, PREGNANCY: HCG, BETA CHAIN, QUANT, S: 4295 m[IU]/mL — AB (ref <5)

## 2017-06-28 LAB — WET PREP, GENITAL
Clue Cells Wet Prep HPF POC: NONE SEEN
SPERM: NONE SEEN
Trich, Wet Prep: NONE SEEN
Yeast Wet Prep HPF POC: NONE SEEN

## 2017-06-28 NOTE — MAU Provider Note (Signed)
History     CSN: [id number]  Arrival date and time: 06/28/17 1008   First Provider Initiated Contact with Patient 06/28/17 1052      Chief Complaint  Patient presents with  . Abdominal Pain  . Vaginal Bleeding   HPI Virginia [name] [id number] 42 y.o. [redacted]w[redacted]d  Comes to MAU today with light vaginal bleeding noted when she is wiping since last night.  She is also having some mild lower abdominal cramping and she is worried about this pregnancy.  OB History    Gravida  5   Para  3   Term  3   Preterm  0   AB  1   Living  3     SAB  1   TAB  0   Ectopic  0   Multiple  0   Live Births  3           Past Medical History:  Diagnosis Date  . [id number](425.9)     Past Surgical History:  Procedure Laterality Date  . NO PAST SURGERIES      History reviewed. No pertinent family history.  Social History   Tobacco Use  . Smoking status: Never Smoker  . Smokeless tobacco: Never Used  Substance Use Topics  . Alcohol use: No  . Drug use: No    Allergies: No Known Allergies  Medications Prior to Admission  Medication Sig Dispense Refill Last Dose  . folic acid (FOLVITE) 400 MCG tablet Take 800 mcg by mouth daily.   06/27/2017 at Unknown time  . Prenatal Vit-Fe Fumarate-FA (PRENATAL MULTIVITAMIN) TABS tablet Take 1 tablet by mouth daily at 12 noon.   06/27/2017 at Unknown time  . amLODipine (NORVASC) 5 MG tablet Take 1 tablet (5 mg total) by mouth daily. (Patient not taking: Reported on 06/24/2017) 30 tablet 5 Not Taking  . benzocaine-Menthol (DERMOPLAST) 20-0.5 % AERO Apply 1 application topically as needed for irritation (perineal discomfort). (Patient not taking: Reported on 06/24/2017) 78 g 0 Not Taking    Review of Systems  Constitutional: Negative for fever.  Gastrointestinal: Positive for abdominal pain. Negative for diarrhea, nausea and vomiting.  Genitourinary: Positive for vaginal bleeding. Negative for dysuria and vaginal discharge.   Physical Exam    Blood pressure 111/68, pulse (!) 51, temperature 98.8 F (37.1 C), temperature source Oral, resp. rate 16, height  (1.676 m), weight 190 lb (86.2 kg), last menstrual period 05/03/2017, SpO2 100 %, currently breastfeeding.  Physical Exam  Nursing note and vitals reviewed. Constitutional: She is oriented to person, place, and time. She appears well-developed and well-nourished.  HENT:  Head: Normocephalic.  Eyes: EOM are normal.  Neck: Neck supple.  GI: Soft. There is no tenderness. There is no rebound and no guarding.  Fundus not palpated.  Genitourinary:  Genitourinary Comments: Declines speculum exam - vaginal swabs done.  Small amount of blood on swabs.  Chaperone present for exam.  Musculoskeletal: Normal range of motion.  Neurological: She is alert and oriented to person, place, and time.  Skin: Skin is warm and dry.  Psychiatric: She has a normal mood and affect.    MAU Course  Procedures Results for orders placed or performed during the hospital encounter of 06/28/17 (from the past 24 hour(s))  Urinalysis, Routine w reflex microscopic     Status: Abnormal   Collection Time: 06/28/17 10:26 AM  Result Value Ref Range   Color, Urine STRAW (A) YELLOW   APPearance CLEAR CLEAR   Specific Gravity,  Urine 1.005 1.005 - 1.030   pH 7.0 5.0 - 8.0   Glucose, UA NEGATIVE NEGATIVE mg/dL   Hgb urine dipstick NEGATIVE NEGATIVE   Bilirubin Urine NEGATIVE NEGATIVE   Ketones, ur NEGATIVE NEGATIVE mg/dL   Protein, ur NEGATIVE NEGATIVE mg/dL   Nitrite NEGATIVE NEGATIVE   Leukocytes, UA NEGATIVE NEGATIVE  Wet prep, genital     Status: Abnormal   Collection Time: 06/28/17 11:00 AM  Result Value Ref Range   Yeast Wet Prep HPF POC NONE SEEN NONE SEEN   Trich, Wet Prep NONE SEEN NONE SEEN   Clue Cells Wet Prep HPF POC NONE SEEN NONE SEEN   WBC, Wet Prep HPF POC MANY (A) NONE SEEN   Sperm NONE SEEN   CBC     Status: None   Collection Time: 06/28/17 11:18 AM  Result Value Ref Range    WBC 5.6 4.0 - 10.5 K/uL   RBC 4.13 3.87 - 5.11 MIL/uL   Hemoglobin 13.0 12.0 - 15.0 g/dL   HCT 16.1 09.6 - 04.5 %   MCV 94.4 78.0 - 100.0 fL   MCH 31.5 26.0 - 34.0 pg   MCHC 33.3 30.0 - 36.0 g/dL   RDW 40.9 81.1 - 91.4 %   Platelets 239 150 - 400 K/uL  hCG, quantitative, pregnancy     Status: Abnormal   Collection Time: 06/28/17 11:18 AM  Result Value Ref Range   hCG, Beta Chain, Quant, S 4,295 (H) <5 mIU/mL    MDM Work up done for pregnancy of unknown anatomic location but IUP was found.  However it is still uncertain if this will be a healthy pregnancy or a miscarriage. Discussed ultrasound results with client.  Ordered follow up US in 11 days - informed her she will go to the clinic after the ultrasound for discussion of results.  Discussed she may have worsening vaginal bleeding and cramping before the ultrasound which would indicate a possible miscarriage.  CLINICAL DATA:  42 year old pregnant female presenting with vaginal bleeding and cramping. Uncertain LMP. Quantitative beta HCG is pending.  EXAM: OBSTETRIC <14 WK [place] AND TRANSVAGINAL OB US  TECHNIQUE: Both transabdominal and transvaginal ultrasound examinations were performed for complete evaluation of the gestation as well as the maternal uterus, adnexal regions, and pelvic cul-de-sac. Transvaginal technique was performed to assess early pregnancy.  COMPARISON:  No prior scans from this gestation.  FINDINGS: Intrauterine gestational sac: Single intrauterine gestational sac appears normal in size, shape and position.  Yolk sac:  Visualized.  Embryo:  Visualized.  Embryonic Cardiac Activity: Not Visualized.  CRL:  2.9 mm   5 w   5 d                  [place] EDC: 02/23/2018  Subchorionic hemorrhage:  None visualized.  Maternal uterus/adnexae: Right ovary measures 2.1 x 1.3 x 2.2 cm. Left ovary (transabdominal measurements) measures 3.5 x 1.5 x 2.3 cm. No abnormal ovarian or adnexal masses. No abnormal  free fluid in the pelvis. No uterine fibroids are demonstrated.  IMPRESSION: 1. Single intrauterine gestation at 5 weeks 5 days by crown-rump length. No embryonic cardiac activity detected, which could be due to early gestational age. Recommend follow-up obstetric scan in 11-14 days for definitive assessment of gestational viability. This recommendation follows SRU consensus guidelines: Diagnostic Criteria for Nonviable Pregnancy Early in the First Trimester. Malva Limes Med 2013; 782:9562-13. 2. No ovarian or adnexal abnormality.   Assessment and Plan  Threatened Miscarriage Vaginal bleeding in  first trimester  Plan Outpatient [place] ordered in 11 days. Take Tylenol 325 mg 2 tablets by mouth every 4 hours if needed for pain. Drink at least 8 8-oz glasses of water every day. Return if you have worsening vaginal bleeding or severe lower abdominal pain. Pelvic rest - no douching, no sex, no tampons.  Arta Stump L Vernecia Umble 06/28/2017, 12:27 PM

## 2017-06-28 NOTE — Discharge Instructions (Signed)
Take Tylenol 325 mg 2 tablets by mouth every 4 hours if needed for pain. Drink at least 8 8-oz glasses of water every day. Return if you have worsening vaginal bleeding or severe lower abdominal pain.   Vaginal Bleeding During Pregnancy, First Trimester A small amount of bleeding (spotting) from the vagina is relatively common in early pregnancy. It usually stops on its own. Various things may cause bleeding or spotting in early pregnancy. Some bleeding may be related to the pregnancy, and some may not. In most cases, the bleeding is normal and is not a problem. However, bleeding can also be a sign of something serious. Be sure to tell your health care provider about any vaginal bleeding right away. Some possible causes of vaginal bleeding during the first trimester include:  Infection or inflammation of the cervix.  Growths (polyps) on the cervix.  Miscarriage or threatened miscarriage.  Pregnancy tissue has developed outside of the uterus and in a fallopian tube (tubal pregnancy).  Tiny cysts have developed in the uterus instead of pregnancy tissue (molar pregnancy).  Follow these instructions at home: Watch your condition for any changes. The following actions may help to lessen any discomfort you are feeling:  Follow your health care provider's instructions for limiting your activity. If your health care provider orders bed rest, you may need to stay in bed and only get up to use the bathroom. However, your health care provider may allow you to continue light activity.  If needed, make plans for someone to help with your regular activities and responsibilities while you are on bed rest.  Keep track of the number of pads you use each day, how often you change pads, and how soaked (saturated) they are. Write this down.  Do not use tampons. Do not douche.  Do not have sexual intercourse or orgasms until approved by your health care provider.  If you pass any tissue from your vagina,  save the tissue so you can show it to your health care provider.  Only take over-the-counter or prescription medicines as directed by your health care provider.  Do not take aspirin because it can make you bleed.  Keep all follow-up appointments as directed by your health care provider.  Contact a health care provider if:  You have any vaginal bleeding during any part of your pregnancy.  You have cramps or labor pains.  You have a fever, not controlled by medicine. Get help right away if:  You have severe cramps in your back or belly (abdomen).  You pass large clots or tissue from your vagina.  Your bleeding increases.  You feel light-headed or weak, or you have fainting episodes.  You have chills.  You are leaking fluid or have a gush of fluid from your vagina.  You pass out while having a bowel movement. This information is not intended to replace advice given to you by your health care provider. Make sure you discuss any questions you have with your health care provider. Document Released: 11/12/2004 Document Revised: 07/11/2015 Document Reviewed: 10/10/2012 Elsevier Interactive Patient Education  2018 Elsevier Inc.   Pelvic Rest Pelvic rest may be recommended if:  Your placenta is partially or completely covering the opening of your cervix (placenta previa).  There is bleeding between the wall of the uterus and the amniotic sac in the first trimester of pregnancy (subchorionic hemorrhage).  You went into labor too early (preterm labor).  Based on your overall health and the health of your baby,  your health care provider will decide if pelvic rest is right for you. How do I rest my pelvis? For as long as told by your health care provider:  Do not have sex, sexual stimulation, or an orgasm.  Do not use tampons. Do not douche. Do not put anything in your vagina.  Do not lift anything that is heavier than 10 lb (4.5 kg).  Avoid activities that take a lot of  effort (are strenuous).  Avoid any activity in which your pelvic muscles could become strained.  When should I seek medical care? Seek medical care if you have:  Cramping pain in your lower abdomen.  Vaginal discharge.  A low, dull backache.  Regular contractions.  Uterine tightening.  When should I seek immediate medical care? Seek immediate medical care if:  You have vaginal bleeding and you are pregnant.  This information is not intended to replace advice given to you by your health care provider. Make sure you discuss any questions you have with your health care provider. Document Released: 05/30/2010 Document Revised: 07/11/2015 Document Reviewed: 08/06/2014 Elsevier Interactive Patient Education  [employer].

## 2017-06-28 NOTE — MAU Note (Signed)
Pt reports spotting since yesterday, some cramping.

## 2017-06-29 LAB — GC/CHLAMYDIA PROBE AMP (~~LOC~~) NOT AT ARMC
Chlamydia: NEGATIVE
Neisseria Gonorrhea: NEGATIVE

## 2017-06-29 LAB — HIV ANTIBODY (ROUTINE TESTING W REFLEX): HIV Screen 4th Generation wRfx: NONREACTIVE

## 2017-06-29 LAB — RPR: RPR Ser Ql: NONREACTIVE

## 2017-06-30 ENCOUNTER — Encounter: Payer: Self-pay | Admitting: Certified Nurse Midwife

## 2017-07-01 ENCOUNTER — Encounter: Payer: Self-pay | Admitting: Certified Nurse Midwife

## 2017-07-01 ENCOUNTER — Other Ambulatory Visit: Payer: Self-pay | Admitting: Certified Nurse Midwife

## 2017-07-01 ENCOUNTER — Other Ambulatory Visit: Payer: Medicaid Other

## 2017-07-01 DIAGNOSIS — Z3200 Encounter for pregnancy test, result unknown: Secondary | ICD-10-CM

## 2017-07-02 ENCOUNTER — Other Ambulatory Visit: Payer: Self-pay | Admitting: Certified Nurse Midwife

## 2017-07-02 DIAGNOSIS — O039 Complete or unspecified spontaneous abortion without complication: Secondary | ICD-10-CM

## 2017-07-02 LAB — BETA HCG QUANT (REF LAB): hCG Quant: 324 m[IU]/mL

## 2017-07-02 MED ORDER — MISOPROSTOL 200 MCG PO TABS: 400.0000 ug | ORAL_TABLET | Freq: Two times a day (BID) | ORAL | 0 refills | Status: DC

## 2017-07-02 NOTE — Progress Notes (Signed)
Cytotec sent to pharmacy for SAB management

## 2017-07-05 ENCOUNTER — Other Ambulatory Visit: Payer: Medicaid Other

## 2017-07-05 DIAGNOSIS — O039 Complete or unspecified spontaneous abortion without complication: Secondary | ICD-10-CM

## 2017-07-06 LAB — BETA HCG QUANT (REF LAB): hCG Quant: 53 m[IU]/mL

## 2017-07-07 ENCOUNTER — Telehealth: Payer: Self-pay | Admitting: Obstetrics and Gynecology

## 2017-07-07 ENCOUNTER — Telehealth: Payer: Self-pay

## 2017-07-07 NOTE — Telephone Encounter (Signed)
Pt informed that HCG has declined from 4200+ to 50's in one week. This indicates spontaneous miscarriage. Pt advised to check a urine pregnancy test in 1 wk and expect a negative result. If pt desires Contraception, she is to contact office.

## 2017-07-07 NOTE — Telephone Encounter (Signed)
-----   Message from Roe Coombs, CNM sent at 07/07/2017  2:39 PM EDT ----- Please have her do another lab only visit for a follow up quant.  Her quant is still above the cut off of five.  Thank you.  R.Denney CNM

## 2017-07-07 NOTE — Telephone Encounter (Signed)
Left VM message to call office and make appt for Quant.

## 2017-07-09 ENCOUNTER — Other Ambulatory Visit: Payer: Medicaid Other

## 2017-07-09 DIAGNOSIS — O039 Complete or unspecified spontaneous abortion without complication: Secondary | ICD-10-CM

## 2017-07-09 NOTE — Addendum Note (Signed)
Addended by: Natale Milch D on: 07/09/2017 09:57 AM   Modules accepted: Orders

## 2017-07-10 LAB — BETA HCG QUANT (REF LAB): hCG Quant: 13 m[IU]/mL

## 2017-07-15 ENCOUNTER — Other Ambulatory Visit (HOSPITAL_COMMUNITY)
Admission: RE | Admit: 2017-07-15 | Discharge: 2017-07-15 | Disposition: A | Discharge status: Home / Self Care | Payer: Medicaid Other | Source: Ambulatory Visit | Attending: Certified Nurse Midwife | Admitting: Certified Nurse Midwife

## 2017-07-15 ENCOUNTER — Ambulatory Visit (INDEPENDENT_AMBULATORY_CARE_PROVIDER_SITE_OTHER): Payer: Medicaid Other | Admitting: Certified Nurse Midwife

## 2017-07-15 VITALS — BP 131/96 | HR 67 | Ht 66.0 in | Wt 194.2 lb

## 2017-07-15 DIAGNOSIS — O039 Complete or unspecified spontaneous abortion without complication: Secondary | ICD-10-CM | POA: Diagnosis not present

## 2017-07-15 DIAGNOSIS — B9689 Other specified bacterial agents as the cause of diseases classified elsewhere: Secondary | ICD-10-CM | POA: Diagnosis not present

## 2017-07-15 DIAGNOSIS — E349 Endocrine disorder, unspecified: Secondary | ICD-10-CM | POA: Diagnosis not present

## 2017-07-15 DIAGNOSIS — B373 Candidiasis of vulva and vagina: Secondary | ICD-10-CM

## 2017-07-15 DIAGNOSIS — N898 Other specified noninflammatory disorders of vagina: Secondary | ICD-10-CM | POA: Insufficient documentation

## 2017-07-15 DIAGNOSIS — N760 Acute vaginitis: Secondary | ICD-10-CM | POA: Diagnosis not present

## 2017-07-15 DIAGNOSIS — R102 Pelvic and perineal pain: Secondary | ICD-10-CM | POA: Insufficient documentation

## 2017-07-15 DIAGNOSIS — I10 Essential (primary) hypertension: Secondary | ICD-10-CM | POA: Diagnosis not present

## 2017-07-15 DIAGNOSIS — B3731 Acute candidiasis of vulva and vagina: Secondary | ICD-10-CM

## 2017-07-15 MED ORDER — FLUCONAZOLE 200 MG PO TABS: 200.0000 mg | ORAL_TABLET | Freq: Once | ORAL | 0 refills | Status: AC

## 2017-07-15 MED ORDER — AMLODIPINE BESYLATE 5 MG PO TABS: 5.0000 mg | ORAL_TABLET | Freq: Every day | ORAL | 1 refills | Status: AC

## 2017-07-15 MED ORDER — METRONIDAZOLE 500 MG PO TABS: 500.0000 mg | ORAL_TABLET | Freq: Two times a day (BID) | ORAL | 0 refills | Status: DC

## 2017-07-15 MED ORDER — TERCONAZOLE 0.8 % VA CREA: 1.0000 | TOPICAL_CREAM | Freq: Every day | VAGINAL | 0 refills | Status: DC

## 2017-07-15 NOTE — Progress Notes (Signed)
Patient ID: Virginia [name], female   DOB: 06-04-1975, 42 y.o.   MRN: [id number]  No chief complaint on file.   HPI Virginia [name] is a 42 y.o. female.  Here for f/u after recent SAB.  Started spotting after last blood draw and had not been spotting for about a week.  Tender to palpation on right lower pelvis.  Reports mild cramping.  Red/Pink bleeding.  Is not taking anything for pain.  Also reports abnormal discharge with odor that started two days ago.  Tried a salt bath this morning. Drove from [place] this AM.    HPI  Past Medical History:  Diagnosis Date  . [id number](478.2)     Past Surgical History:  Procedure Laterality Date  . NO PAST SURGERIES      No family history on file.  Social History Social History   Tobacco Use  . Smoking status: Never Smoker  . Smokeless tobacco: Never Used  Substance Use Topics  . Alcohol use: No  . Drug use: No    No Known Allergies  Current Outpatient Medications  Medication Sig Dispense Refill  . folic acid (FOLVITE) 400 MCG tablet Take 800 mcg by mouth daily.    . Prenatal Vit-Fe Fumarate-FA (PRENATAL MULTIVITAMIN) TABS tablet Take 1 tablet by mouth daily at 12 noon.    . fluconazole (DIFLUCAN) 200 MG tablet Take 1 tablet (200 mg total) by mouth once for 1 dose. Repeat dose in 48-72 hours. 3 tablet 0  . metroNIDAZOLE (FLAGYL) 500 MG tablet Take 1 tablet (500 mg total) by mouth 2 (two) times daily. 14 tablet 0  . misoprostol (CYTOTEC) 200 MCG tablet Place 2 tablets (400 mcg total) vaginally 2 (two) times daily. (Patient not taking: Reported on 07/15/2017) 8 tablet 0  . terconazole (TERAZOL 3) 0.8 % vaginal cream Place 1 applicator vaginally at bedtime. 20 g 0   No current facility-administered medications for this visit.     Review of Systems Review of Systems Constitutional: negative for fatigue and weight loss Respiratory: negative for cough and wheezing Cardiovascular: negative for chest pain, fatigue and  palpitations Gastrointestinal: negative for abdominal pain and change in bowel habits Genitourinary: +Vag discharge, lower pelvic pain Integument/breast: negative for nipple discharge Musculoskeletal:negative for myalgias Neurological: negative for gait problems and tremors Behavioral/Psych: negative for abusive relationship, depression Endocrine: negative for temperature intolerance      Blood pressure (!) 131/96, pulse 67, height  (1.676 m), weight 194 lb 3.2 oz (88.1 kg), last menstrual period 05/03/2017, currently breastfeeding.  Physical Exam Physical Exam General:   alert  Skin:   no rash or abnormalities  Lungs:   clear to auscultation bilaterally  Heart:   regular rate and rhythm, S1, S2 normal, no murmur, click, rub or gallop  Breasts:   deferred  Abdomen:  normal findings: no organomegaly, soft, non-tender and no hernia  Pelvis:  External genitalia: normal general appearance Urinary system: urethral meatus normal and bladder without fullness, nontender Vaginal: normal without tenderness, induration or masses, +rubera blood present, +thin gray vaginal discharge, +white chunky discharge Cervix: deferred Adnexa: deferred Uterus:deferred    50% of 20 min visit spent on counseling and coordination of care.   Data Reviewed Previous medical hx, meds, labs  Assessment     1. SAB (spontaneous abortion)    Recent SAB - Beta hCG quant (ref lab) - US PELVIC COMPLETE WITH TRANSVAGINAL; Future  2. Elevated serum hCG     - Beta hCG quant (ref lab) -  US PELVIC COMPLETE WITH TRANSVAGINAL; Future  3. Pelvic pain in female     - US PELVIC COMPLETE WITH TRANSVAGINAL; Future - Cervicovaginal ancillary only  4. Vaginal discharge     - Cervicovaginal ancillary only  5. Yeast vaginitis     - fluconazole (DIFLUCAN) 200 MG tablet; Take 1 tablet (200 mg total) by mouth once for 1 dose. Repeat dose in 48-72 hours.  Dispense: 3 tablet; Refill: 0 - terconazole (TERAZOL 3) 0.8  % vaginal cream; Place 1 applicator vaginally at bedtime.  Dispense: 20 g; Refill: 0  6. BV (bacterial vaginosis)     - metroNIDAZOLE (FLAGYL) 500 MG tablet; Take 1 tablet (500 mg total) by mouth 2 (two) times daily.  Dispense: 14 tablet; Refill: 0  7. Chronic hypertension    Plan    Orders Placed This Encounter  Procedures  . US PELVIC COMPLETE WITH TRANSVAGINAL    Standing Status:   Future    Standing Expiration Date:   09/15/2018    Order Specific Question:   Reason for Exam (SYMPTOM  OR DIAGNOSIS REQUIRED)    Answer:   AUB    Order Specific Question:   Preferred imaging location?    Answer:   [facility]  . Beta hCG quant (ref lab)   Meds ordered this encounter  Medications  . metroNIDAZOLE (FLAGYL) 500 MG tablet    Sig: Take 1 tablet (500 mg total) by mouth 2 (two) times daily.    Dispense:  14 tablet    Refill:  0  . fluconazole (DIFLUCAN) 200 MG tablet    Sig: Take 1 tablet (200 mg total) by mouth once for 1 dose. Repeat dose in 48-72 hours.    Dispense:  3 tablet    Refill:  0  . terconazole (TERAZOL 3) 0.8 % vaginal cream    Sig: Place 1 applicator vaginally at bedtime.    Dispense:  20 g    Refill:  0     Possible management options include: Weight loss management  Follow up 1 week.

## 2017-07-15 NOTE — Progress Notes (Signed)
Pt reports she is still spotting, light pink.

## 2017-07-16 LAB — CERVICOVAGINAL ANCILLARY ONLY
BACTERIAL VAGINITIS: NEGATIVE
Candida vaginitis: POSITIVE — AB

## 2017-07-16 LAB — BETA HCG QUANT (REF LAB): HCG QUANT: 2 m[IU]/mL

## 2017-07-19 ENCOUNTER — Other Ambulatory Visit: Payer: Self-pay | Admitting: Certified Nurse Midwife

## 2017-07-19 DIAGNOSIS — B3731 Acute candidiasis of vulva and vagina: Secondary | ICD-10-CM

## 2017-07-19 DIAGNOSIS — B373 Candidiasis of vulva and vagina: Secondary | ICD-10-CM

## 2017-07-19 MED ORDER — FLUCONAZOLE 200 MG PO TABS: 200.0000 mg | ORAL_TABLET | Freq: Once | ORAL | 0 refills | Status: AC

## 2017-07-20 ENCOUNTER — Ambulatory Visit (HOSPITAL_COMMUNITY)
Admission: RE | Admit: 2017-07-20 | Discharge: 2017-07-20 | Disposition: A | Discharge status: Home / Self Care | Payer: Medicaid Other | Source: Ambulatory Visit | Attending: Certified Nurse Midwife | Admitting: Certified Nurse Midwife

## 2017-07-20 DIAGNOSIS — E349 Endocrine disorder, unspecified: Secondary | ICD-10-CM | POA: Insufficient documentation

## 2017-07-20 DIAGNOSIS — R102 Pelvic and perineal pain: Secondary | ICD-10-CM | POA: Diagnosis not present

## 2017-07-20 DIAGNOSIS — O209 Hemorrhage in early pregnancy, unspecified: Secondary | ICD-10-CM

## 2017-07-20 DIAGNOSIS — O039 Complete or unspecified spontaneous abortion without complication: Secondary | ICD-10-CM | POA: Diagnosis not present

## 2017-07-22 ENCOUNTER — Ambulatory Visit (INDEPENDENT_AMBULATORY_CARE_PROVIDER_SITE_OTHER): Payer: Medicaid Other | Admitting: Certified Nurse Midwife

## 2017-07-22 ENCOUNTER — Encounter: Payer: Self-pay | Admitting: Certified Nurse Midwife

## 2017-07-22 VITALS — BP 137/93 | HR 67 | Wt 191.2 lb

## 2017-07-22 DIAGNOSIS — I10 Essential (primary) hypertension: Secondary | ICD-10-CM

## 2017-07-22 DIAGNOSIS — E6609 Other obesity due to excess calories: Secondary | ICD-10-CM | POA: Diagnosis not present

## 2017-07-22 DIAGNOSIS — Z6835 Body mass index (BMI) 35.0-35.9, adult: Secondary | ICD-10-CM | POA: Diagnosis not present

## 2017-07-22 MED ORDER — HYDROCHLOROTHIAZIDE 12.5 MG PO CAPS: 12.5000 mg | ORAL_CAPSULE | Freq: Every day | ORAL | 5 refills | Status: AC

## 2017-07-22 MED ORDER — PHENTERMINE HCL 37.5 MG PO CAPS: 37.5000 mg | ORAL_CAPSULE | ORAL | 2 refills | Status: AC

## 2017-07-22 NOTE — Progress Notes (Signed)
Pt is here for follow up from US. Pt denies any bleeding or any other symptoms.

## 2017-07-22 NOTE — Progress Notes (Signed)
Patient ID: Virginia [name], female   DOB: 09/19/1975, 42 y.o.   MRN: [id number]  Chief Complaint  Patient presents with  . Follow-up    HPI Virginia [name] is a 42 y.o. female.  Here for f/u on CHTN, is not taking medications or following a diet currently.  Desires weight loss.  TVUS results WNL except for increased uterine lining thickness of 13 mm; most likely consistent with secretory phase of her cycle.   Desires weight loss, discussed waiting for pregnancy for a few months until blood pressure, diet, exercise and weight loss have occurred.  Side effects reviewed of Phentermine.  Patient and spouse verbalized understanding.       HPI  Past Medical History:  Diagnosis Date  . HYQMVHQI(696.2Headache(784.0)     Past Surgical History:  Procedure Laterality Date  . NO PAST SURGERIES      No family history on file.  Social History Social History   Tobacco Use  . Smoking status: Never Smoker  . Smokeless tobacco: Never Used  Substance Use Topics  . Alcohol use: No  . Drug use: No    No Known Allergies  Current Outpatient Medications  Medication Sig Dispense Refill  . amLODipine (NORVASC) 5 MG tablet Take 1 tablet (5 mg total) by mouth daily. 30 tablet 1  . folic acid (FOLVITE) 400 MCG tablet Take 800 mcg by mouth daily.    . hydrochlorothiazide (MICROZIDE) 12.5 MG capsule Take 1 capsule (12.5 mg total) by mouth daily. 30 capsule 5  . metroNIDAZOLE (FLAGYL) 500 MG tablet Take 1 tablet (500 mg total) by mouth 2 (two) times daily. (Patient not taking: Reported on 07/22/2017) 14 tablet 0  . misoprostol (CYTOTEC) 200 MCG tablet Place 2 tablets (400 mcg total) vaginally 2 (two) times daily. (Patient not taking: Reported on 07/15/2017) 8 tablet 0  . phentermine 37.5 MG capsule Take 1 capsule (37.5 mg total) by mouth every morning. 30 capsule 2  . Prenatal Vit-Fe Fumarate-FA (PRENATAL MULTIVITAMIN) TABS tablet Take 1 tablet by mouth daily at 12 noon.    Marland Kitchen. terconazole (TERAZOL 3)  0.8 % vaginal cream Place 1 applicator vaginally at bedtime. (Patient not taking: Reported on 07/22/2017) 20 g 0   No current facility-administered medications for this visit.     Review of Systems Review of Systems Constitutional: negative for fatigue and weight loss Respiratory: negative for cough and wheezing Cardiovascular: negative for chest pain, fatigue and palpitations Gastrointestinal: negative for abdominal pain and change in bowel habits Genitourinary:negative Integument/breast: negative for nipple discharge Musculoskeletal:negative for myalgias Neurological: negative for gait problems and tremors Behavioral/Psych: negative for abusive relationship, depression Endocrine: negative for temperature intolerance      Blood pressure (!) 137/93, pulse 67, weight 191 lb 3.2 oz (86.7 kg), last menstrual period 05/03/2017, currently breastfeeding.  Physical Exam Physical Exam General:   alert  Skin:   no rash or abnormalities  Lungs:   clear to auscultation bilaterally  Heart:   regular rate and rhythm, S1, S2 normal, no murmur, click, rub or gallop  Breasts:   deferred  Abdomen:  normal findings: no organomegaly, soft, non-tender and no hernia  Pelvis:  deferred    50% of 15 min visit spent on counseling and coordination of care.   Data Reviewed Previous medical hx, meds, labs  Assessment     1. Chronic hypertension    - hydrochlorothiazide (MICROZIDE) 12.5 MG capsule; Take 1 capsule (12.5 mg total) by mouth daily.  Dispense: 30 capsule; Refill:  5  2. Class 2 obesity due to excess calories without serious comorbidity with body mass index (BMI) of 35.0 to 35.9 in adult     - phentermine 37.5 MG capsule; Take 1 capsule (37.5 mg total) by mouth every morning.  Dispense: 30 capsule; Refill: 2     Plan    Postpone future pregnancy for a few months.  CHTN: diet, exercise and medications started  Meds ordered this encounter  Medications  . phentermine 37.5 MG capsule     Sig: Take 1 capsule (37.5 mg total) by mouth every morning.    Dispense:  30 capsule    Refill:  2  . hydrochlorothiazide (MICROZIDE) 12.5 MG capsule    Sig: Take 1 capsule (12.5 mg total) by mouth daily.    Dispense:  30 capsule    Refill:  5    Possible management options include: bariatric consultation with Dr. Dalbert Garnet Follow up as needed.

## 2017-09-12 IMAGING — US US MFM OB DETAIL+14 WK
1 series · 14 of 28 positions shown · non-contrast
Comparison: none

[Series 1: us mfm ob detail+14 wk · 108 acquisitions, 14 frames shown]
[im 4/108]
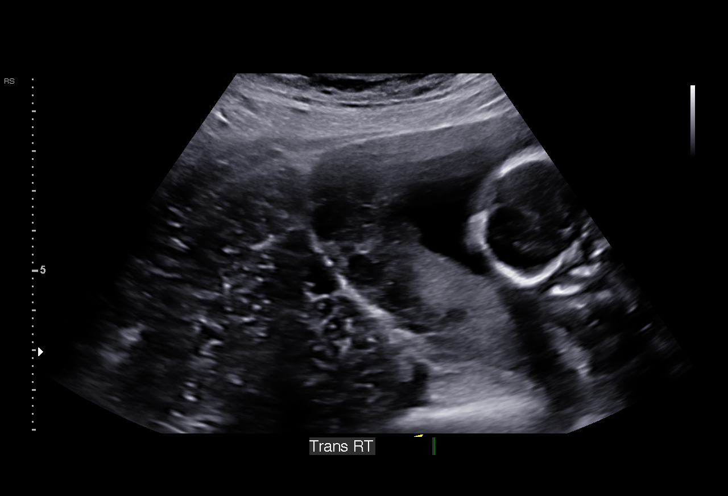
[im 12/108]
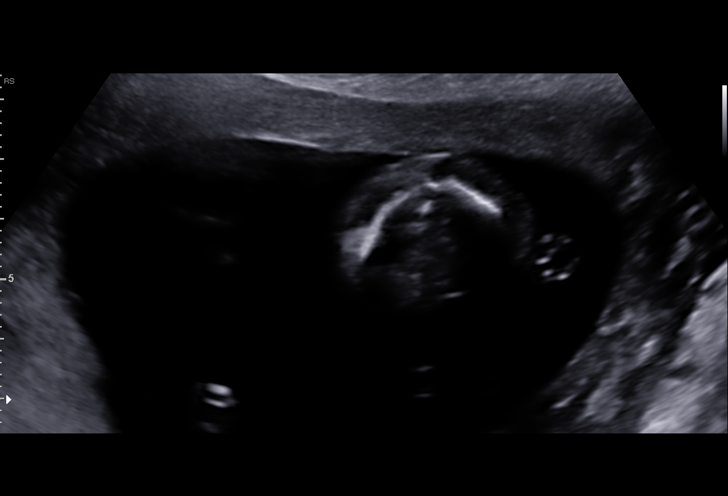
[im 20/108]
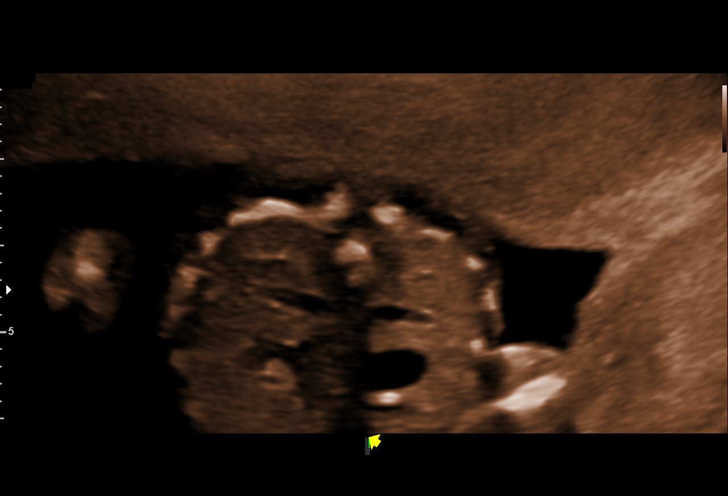
[im 28/108]
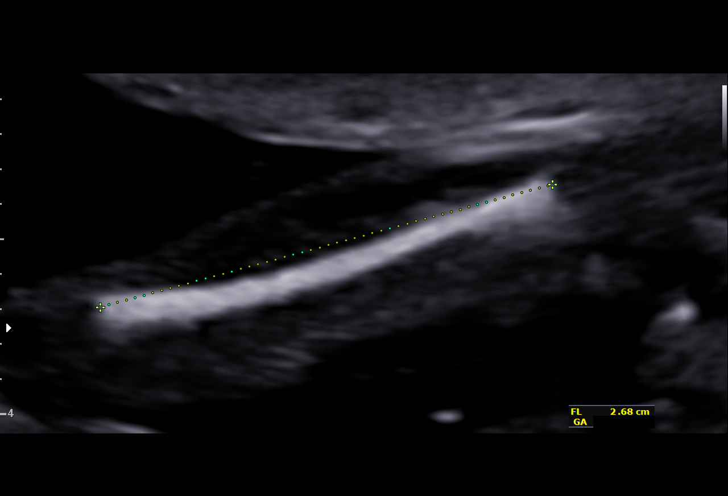
[im 36/108]
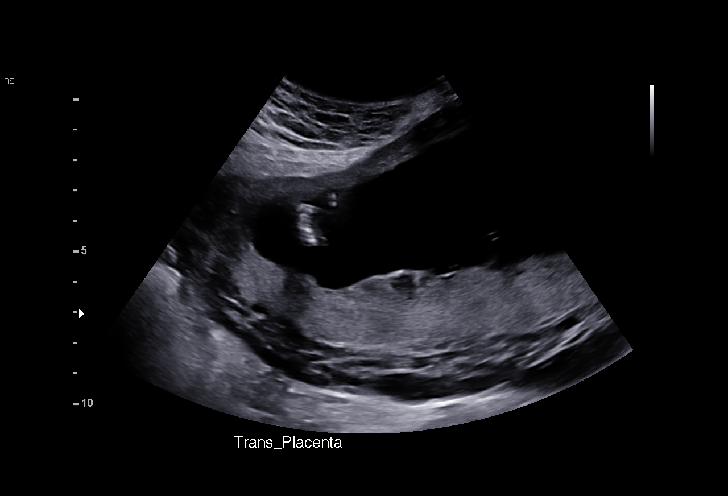
[im 44/108]
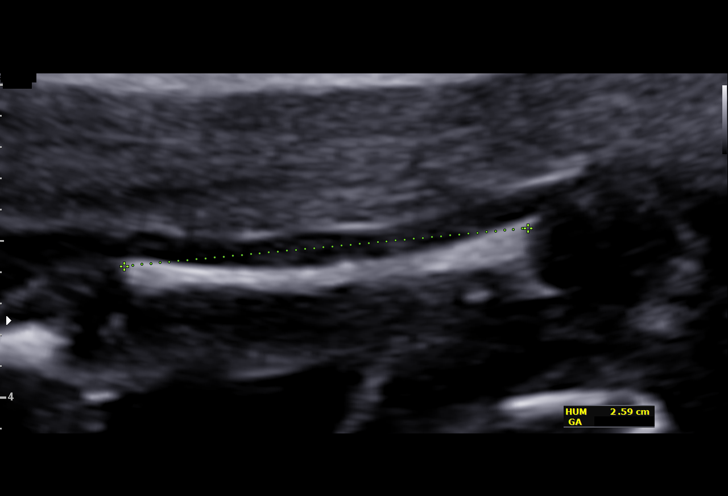
[im 52/108]
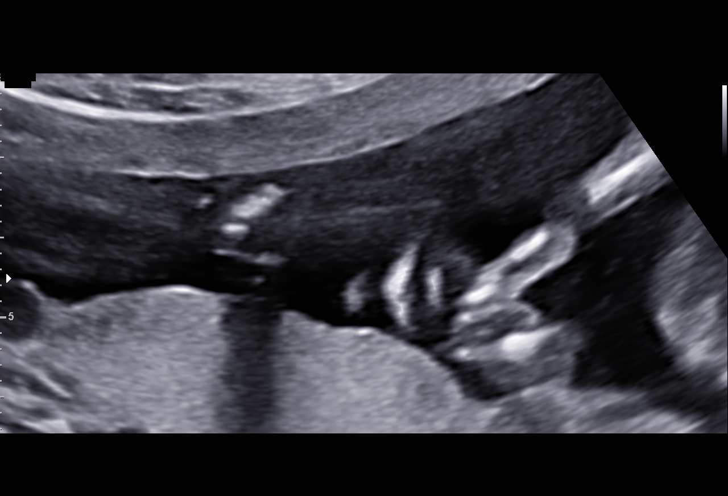
[im 60/108]
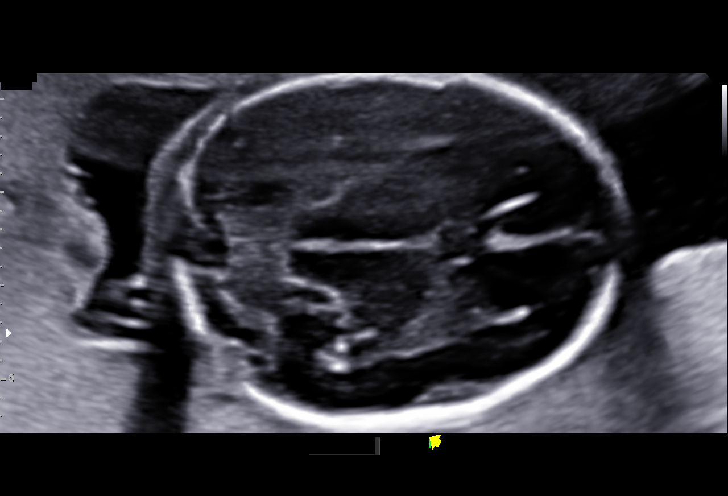
[im 68/108]
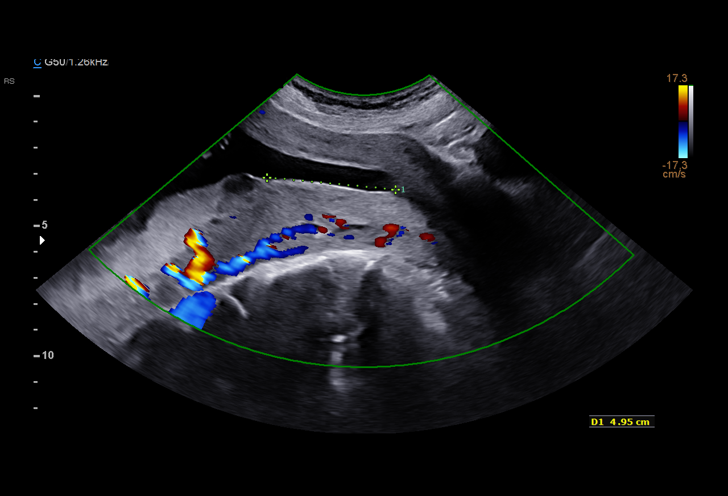
[im 76/108]
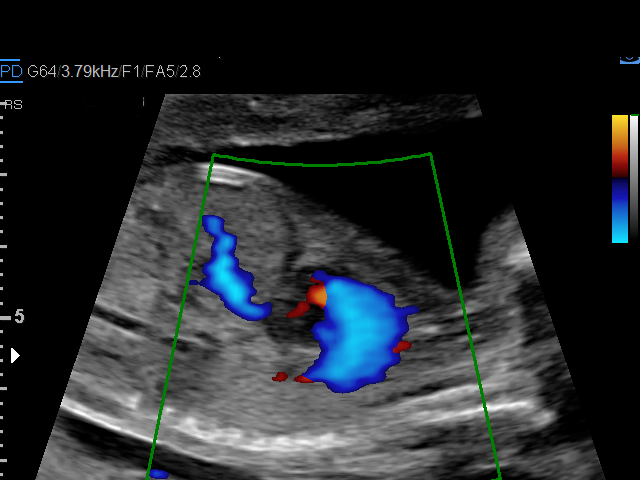
[im 84/108]
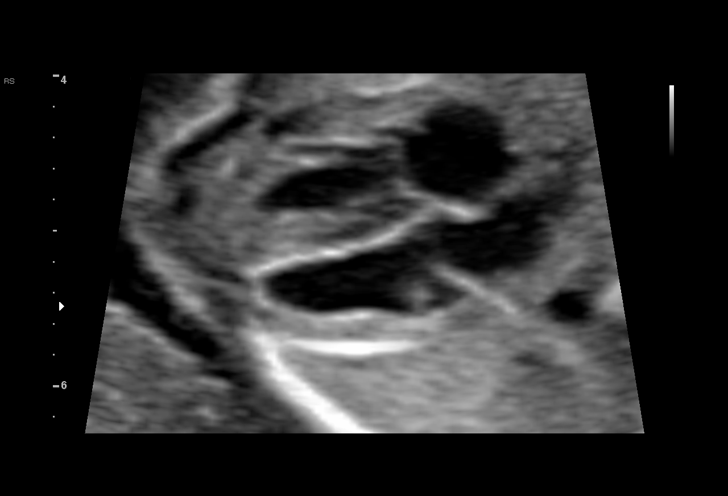
[im 92/108]
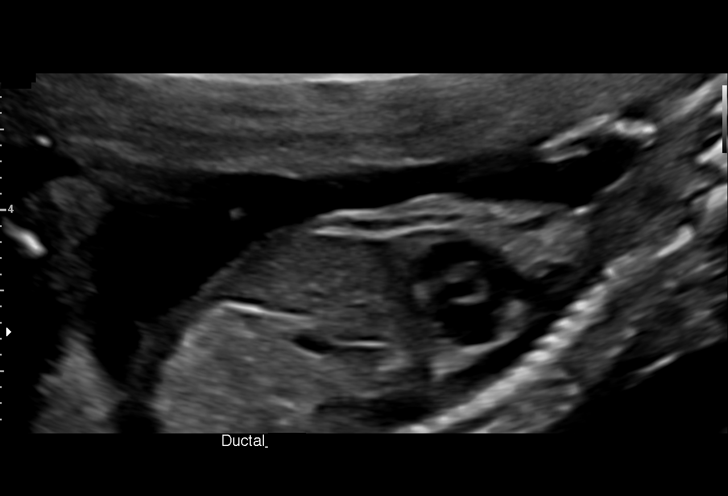
[im 100/108]
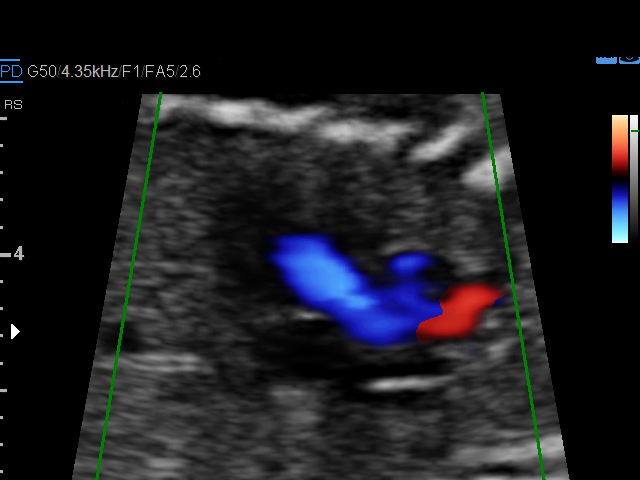
[im 108/108]
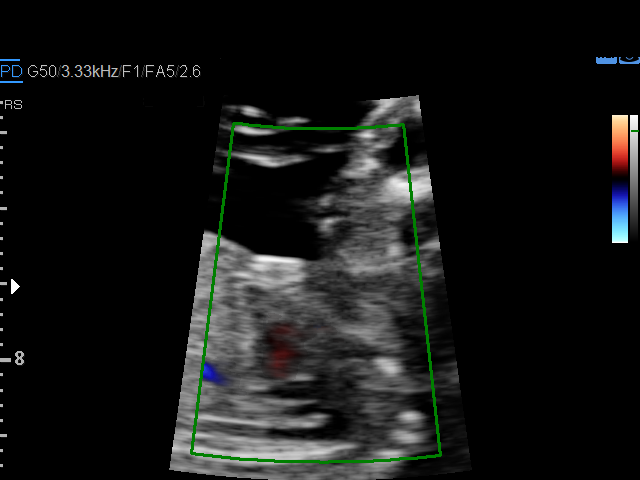

[14 of 28 positions shown; findings below may reference images not displayed]

Road [HOSPITAL]

Indications

18 weeks gestation of pregnancy
Encounter for antenatal screening for
malformations
Advanced maternal age multigravida 35+,
second trimester; low risk NIPS
OB History

Gravidity:    4         Term:   2         SAB:   1
Living:       1
Fetal Evaluation

Num Of Fetuses:     1
Fetal Heart         142
Rate(bpm):
Cardiac Activity:   Observed
Presentation:       Transverse, head to maternal right
Placenta:           Posterior, above cervical os
P. Cord Insertion:  Visualized, central

Amniotic Fluid
AFI FV:      Subjectively within normal limits

Largest Pocket(cm)
5.13
Biometry

BPD:        38  mm     G. Age:  17w 4d         21  %    CI:        68.46   %    70 - 86
FL/HC:       18.5  %    15.8 - 18
HC:      146.8  mm     G. Age:  17w 6d         22  %    HC/AC:       1.15       1.07 -
AC:      128.1  mm     G. Age:  18w 3d         50  %    FL/BPD:      71.3  %
FL:       27.1  mm     G. Age:  18w 2d         44  %    FL/AC:       21.2  %    20 - 24
HUM:      26.5  mm     G. Age:  18w 2d         57  %
CER:      19.5  mm     G. Age:  18w 5d         65  %
NFT:       3.1  mm

CM:        4.5  mm

Est. FW:     231   gm     0 lb 8 oz     47  %
Gestational Age

LMP:           18w 2d        Date:  11/18/15                 EDD:   08/24/16
U/S Today:     18w 0d                                        EDD:   08/26/16
Best:          18w 2d     Det. By:  LMP  (11/18/15)          EDD:   08/24/16
Anatomy

Cranium:               Appears normal         Aortic Arch:            Appears normal
Cavum:                 Appears normal         Ductal Arch:            Appears normal
Ventricles:            Appears normal         Diaphragm:              Appears normal
Choroid Plexus:        Appears normal         Stomach:                Appears normal, left
sided
Cerebellum:            Appears normal         Abdomen:                Appears normal
Posterior Fossa:       Appears normal         Abdominal Wall:         Appears nml (cord
insert, abd wall)
Nuchal Fold:           Appears normal         Cord Vessels:           Appears normal (3
vessel cord)
Face:                  Appears normal         Kidneys:                Appear normal
(orbits and profile)
Lips:                  Appears normal         Bladder:                Appears normal
Thoracic:              Appears normal         Spine:                  Appears normal
Heart:                 Appears normal         Upper Extremities:      Appears normal
(4CH, axis, and
situs)
RVOT:                  Appears normal         Lower Extremities:      Appears normal
LVOT:                  Appears normal

Other:  Fetus appears to be a female. Heels and 5th digit visualized.
Technically difficult due to fetal position.
Cervix Uterus Adnexa

Cervix
Length:           6.16  cm.
Normal appearance by transabdominal scan.

Uterus
No abnormality visualized.

Left Ovary
Within normal limits.

Right Ovary
Not visualized.

Adnexa:       No abnormality visualized. No adnexal mass
visualized.
Impression

SIUP at 18+2 weeks
Normal detailed fetal anatomy
Markers of aneuploidy: none
Normal amniotic fluid volume
Measurements consistent with LMP dating

Ms. Hassen declined genetic counseling and
amniocentesis.
Recommendations

Follow-up ultrasound for growth in 6-8 weeks

## 2017-09-24 ENCOUNTER — Ambulatory Visit: Payer: Medicaid Other | Admitting: Family Medicine

## 2017-09-24 DIAGNOSIS — Z0289 Encounter for other administrative examinations: Secondary | ICD-10-CM

## 2017-11-07 IMAGING — US US MFM OB FOLLOW-UP
1 series · 14 of 28 positions shown · non-contrast
Comparison: none

[Series 1: us mfm ob follow-up · 14 of 49 slices shown]
[im 2/49]
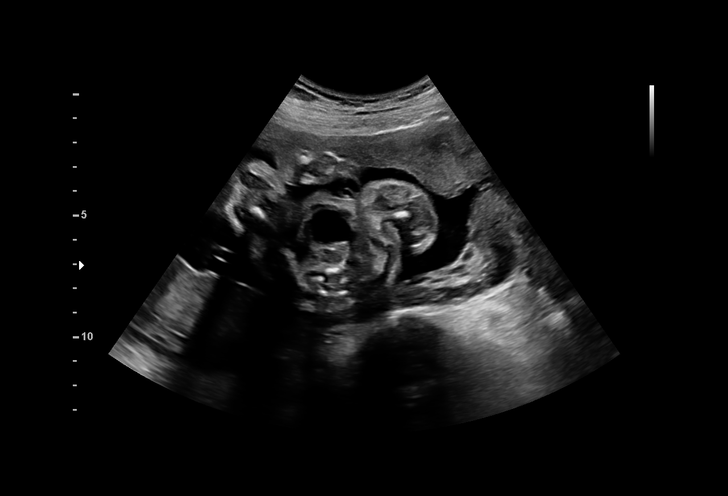
[im 6/49]
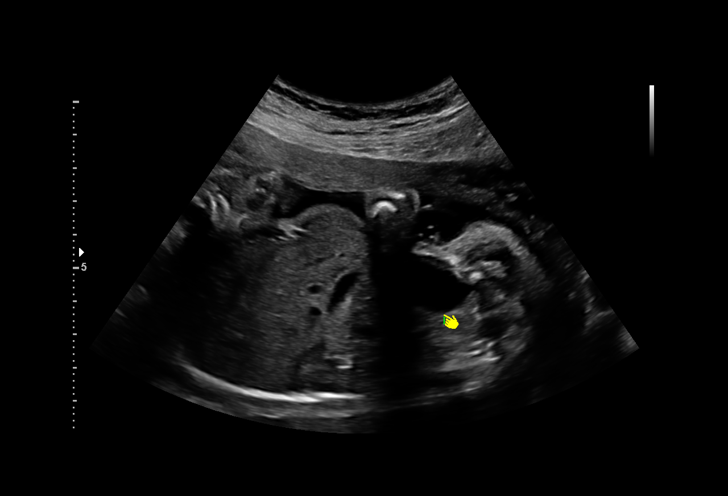
[im 9/49]
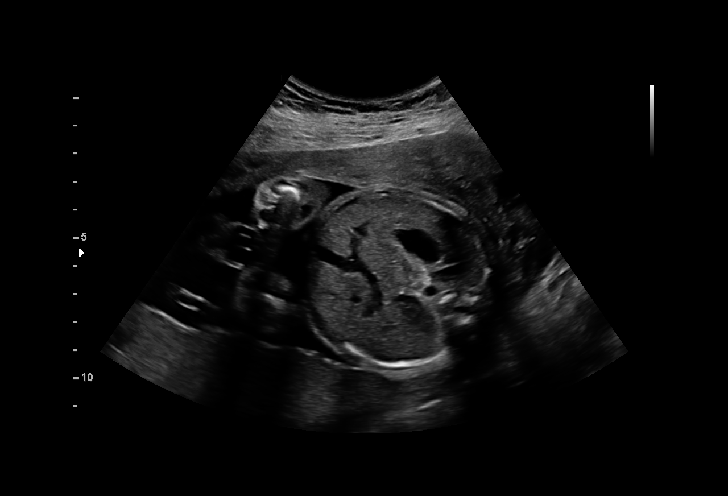
[im 13/49]
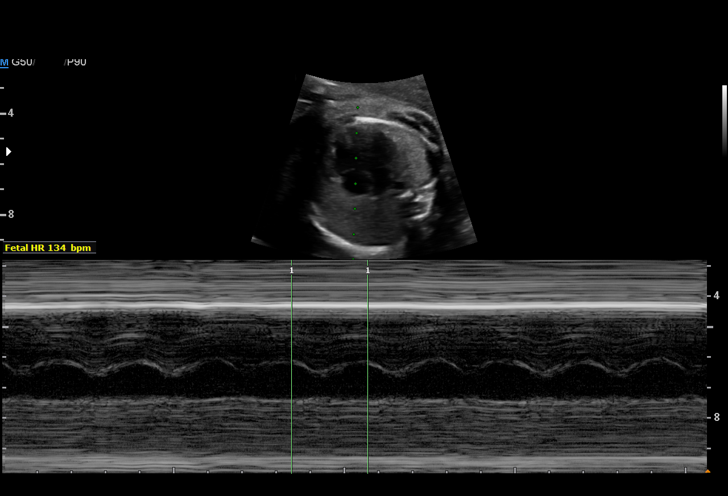
[im 17/49]
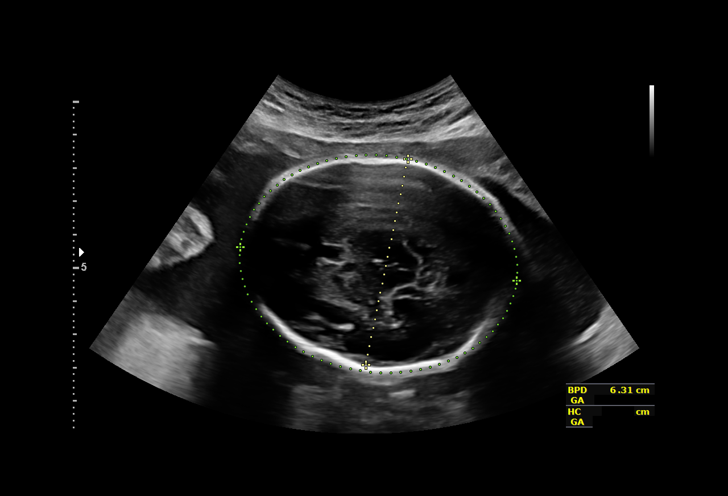
[im 20/49]
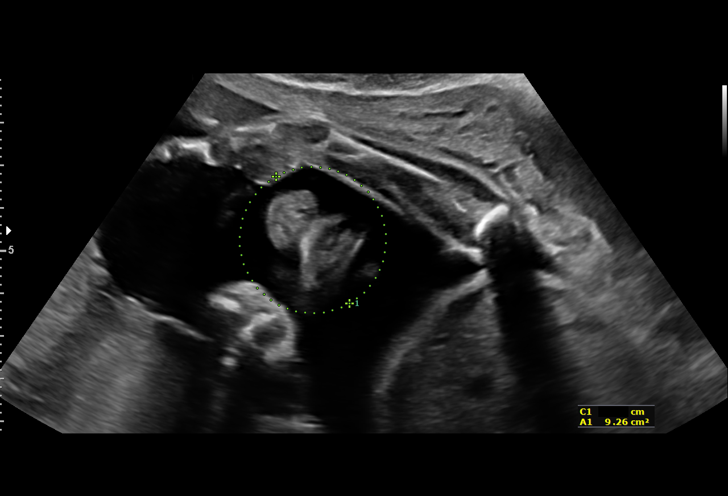
[im 24/49]
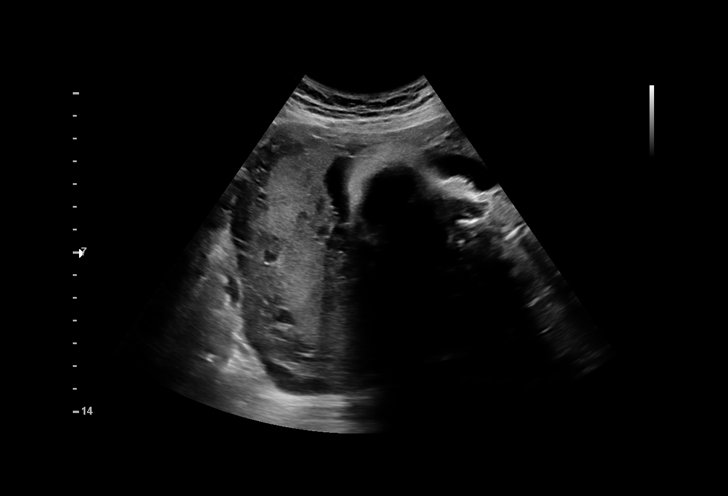
[im 27/49]
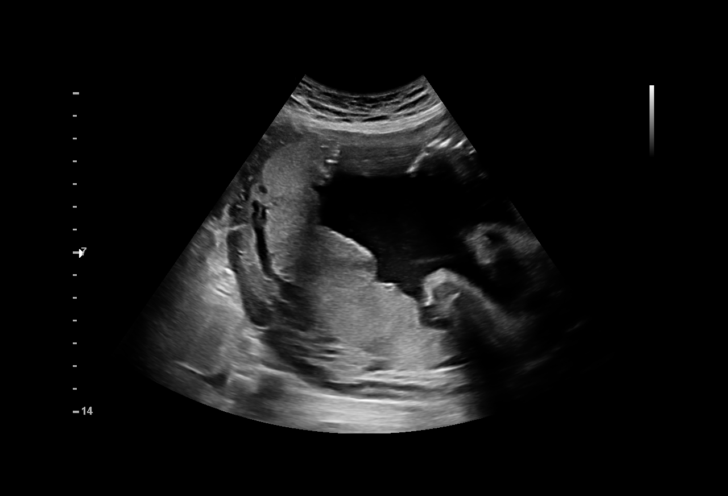
[im 31/49]
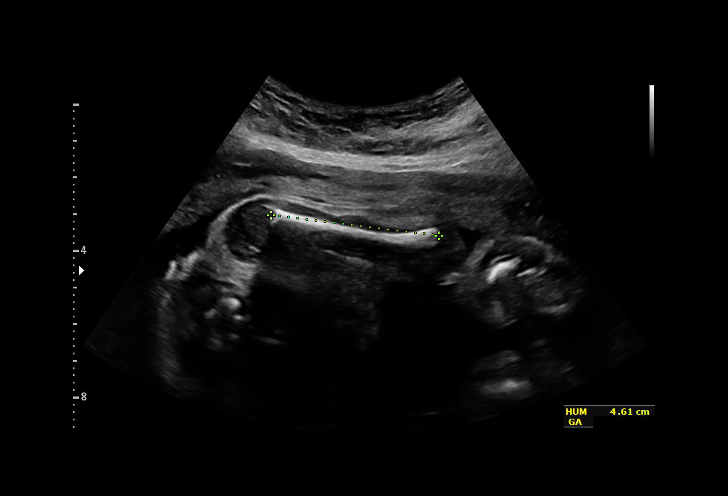
[im 34/49]
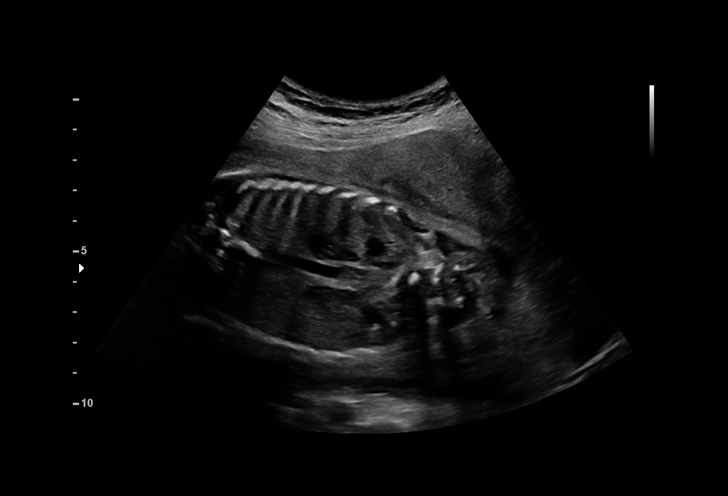
[im 38/49]
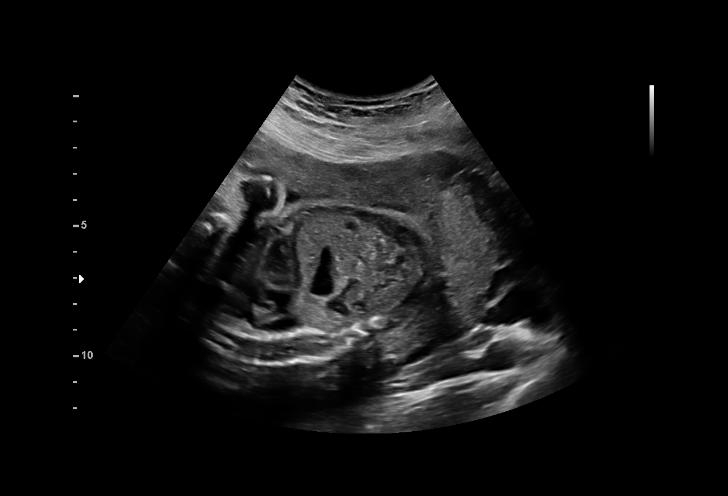
[im 41/49]
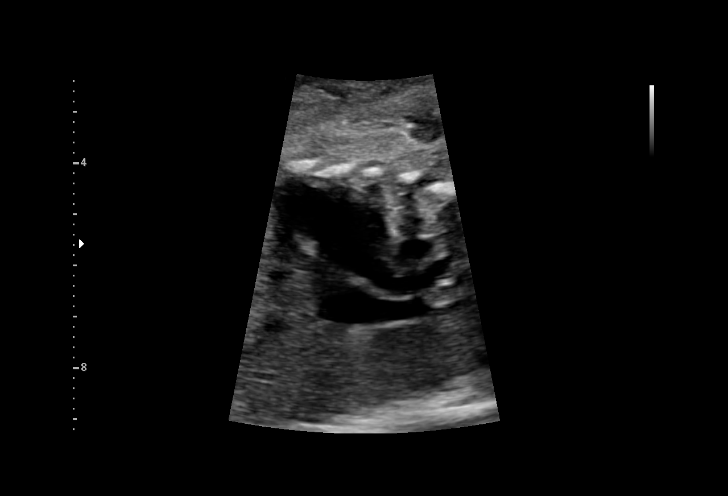
[im 45/49]
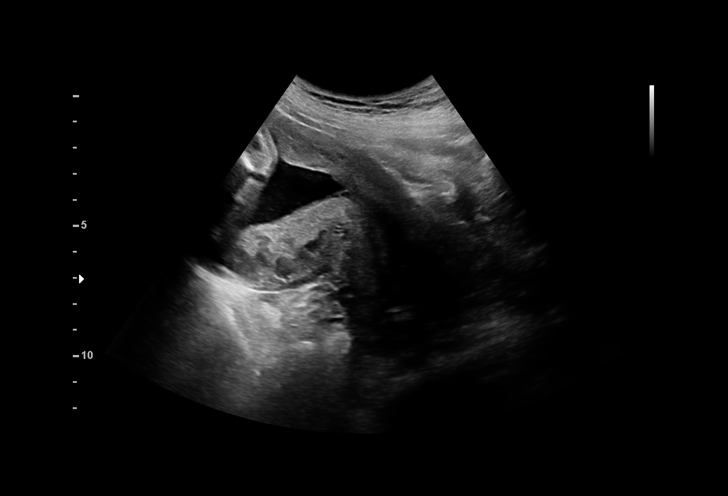
[im 49/49]
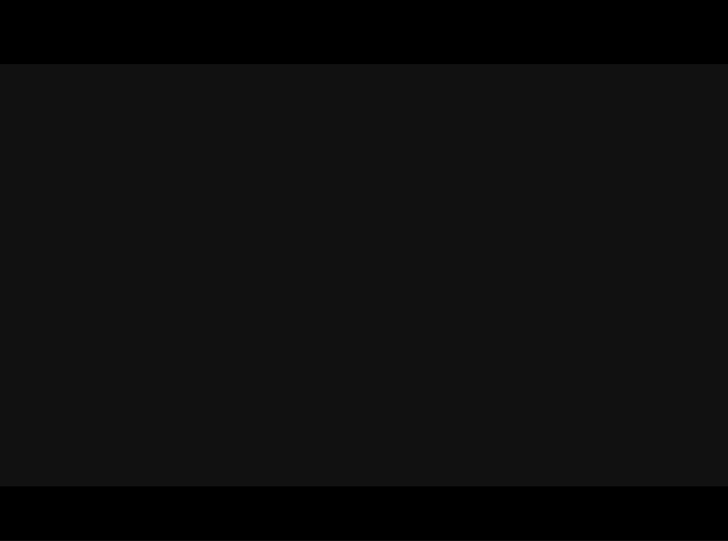

[14 of 28 positions shown; findings below may reference images not displayed]

Road [HOSPITAL]

1  WIRNGO DELMA            [id number]      [id number]     [id number]
Indications

26 weeks gestation of pregnancy
Encounter for other antenatal screening
follow-up
Advanced maternal age multigravida
35+(40), second trimester; low risk NIPS
OB History

Gravidity:    4         Term:   2         SAB:   1
Living:       1
Fetal Evaluation

Num Of Fetuses:     1
Fetal Heart         134
Rate(bpm):
Cardiac Activity:   Observed
Presentation:       Breech
Placenta:           Posterior, above cervical os

Amniotic Fluid
AFI FV:      Subjectively within normal limits

Largest Pocket(cm)
5.22
Biometry

BPD:        63  mm     G. Age:  25w 4d         17  %    CI:        72.41   %    70 - 86
FL/HC:       21.0  %    18.6 -
HC:      235.5  mm     G. Age:  25w 4d          9  %    HC/AC:       1.07       1.04 -
AC:      220.2  mm     G. Age:  26w 3d         47  %    FL/BPD:      78.6  %    71 - 87
FL:       49.5  mm     G. Age:  26w 5d         49  %    FL/AC:       22.5  %    20 - 24
HUM:      45.6  mm     G. Age:  27w 0d         62  %

Est. FW:     931   gm     2 lb 1 oz     56  %
Gestational Age

LMP:           26w 2d        Date:  11/18/15                 EDD:   08/24/16
U/S Today:     26w 1d                                        EDD:   08/25/16
Best:          26w 2d     Det. By:  LMP  (11/18/15)          EDD:   08/24/16
Anatomy

Cranium:               Appears normal         Aortic Arch:            Previously seen
Cavum:                 Appears normal         Ductal Arch:            Previously seen
Ventricles:            Appears normal         Diaphragm:              Appears normal
Choroid Plexus:        Previously seen        Stomach:                Appears normal, left
sided
Cerebellum:            Previously seen        Abdomen:                Appears normal
Posterior Fossa:       Previously seen        Abdominal Wall:         Previously seen
Nuchal Fold:           Previously seen        Cord Vessels:           Previously seen
Face:                  Orbits and profile     Kidneys:                Appear normal
previously seen
Lips:                  Previously seen        Bladder:                Appears normal
Thoracic:              Appears normal         Spine:                  Previously seen
Heart:                 Appears normal         Upper Extremities:      Previously seen
(4CH, axis, and
situs)
RVOT:                  Previously seen        Lower Extremities:      Previously seen
LVOT:                  Appears normal

Other:  Fetus appears to be a female previously seen. Heels and 5th digit
previously visualized. Technically difficult due to fetal position.
Cervix Uterus Adnexa

Cervix
Length:           5.33  cm.
Normal appearance by transabdominal scan.

Uterus
No abnormality visualized.

Left Ovary
Not visualized.

Right Ovary
Not visualized.
Impression

SIUP at 26+2 weeks
Normal interval anatomy; anatomic survey complete
Normal amniotic fluid volume
Appropriate interval growth with EFW at the 56th %tile
Recommendations

Follow-up ultrasound for growth in 6-8 weeks

## 2018-12-16 IMAGING — US US OB TRANSVAGINAL
1 series · 15 of 28 positions shown · non-contrast
Comparison: No prior scans from this gestation.

CLINICAL DATA: 41-year-old pregnant female presenting with vaginal
bleeding and cramping. Uncertain LMP. Quantitative beta HCG is
pending.

EXAM:
OBSTETRIC <14 WK US AND TRANSVAGINAL OB US
TECHNIQUE: Both transabdominal and transvaginal ultrasound examinations were
performed for complete evaluation of the gestation as well as the
maternal uterus, adnexal regions, and pelvic cul-de-sac.
Transvaginal technique was performed to assess early pregnancy.

[Series 1: us ob transvaginal · 15 of 57 slices shown]
[im 1/57]
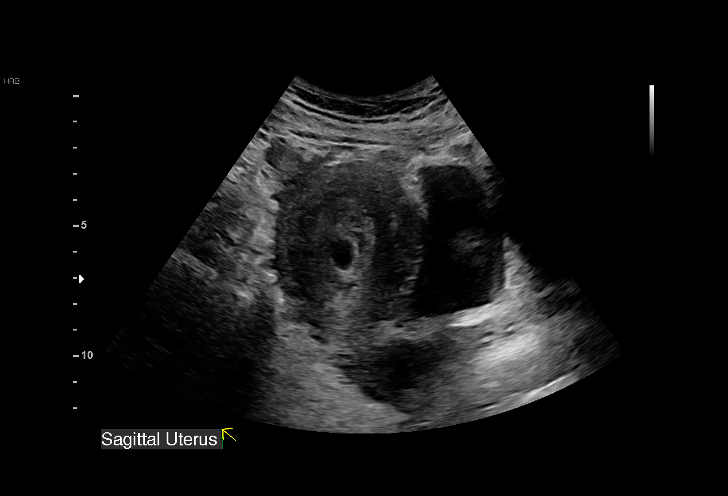
[im 5/57]
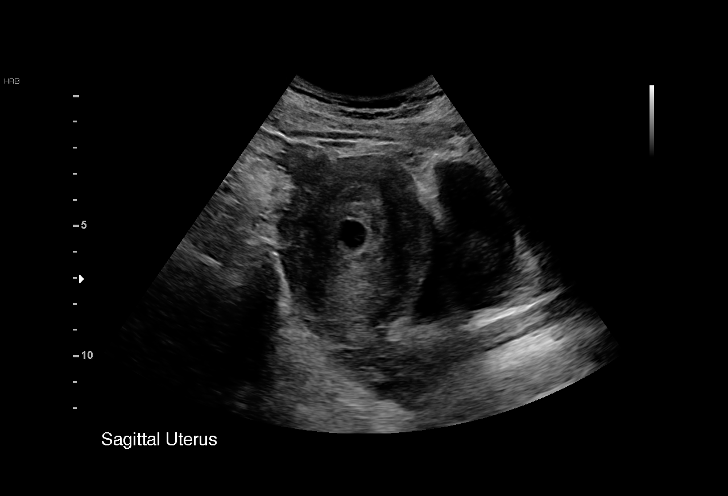
[im 9/57]
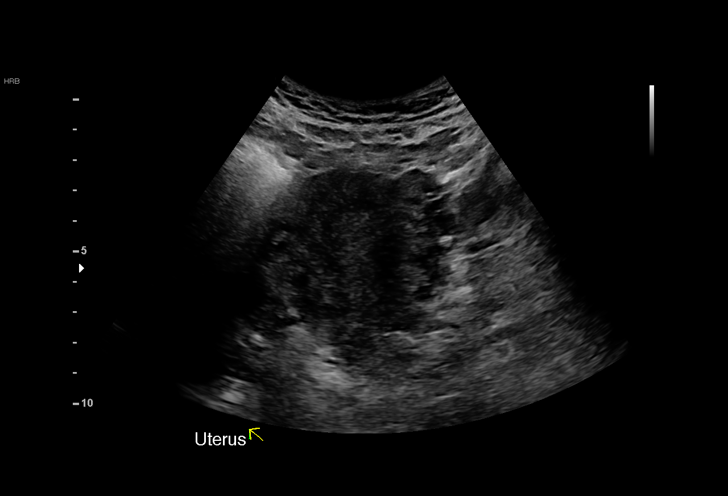
[im 13/57]
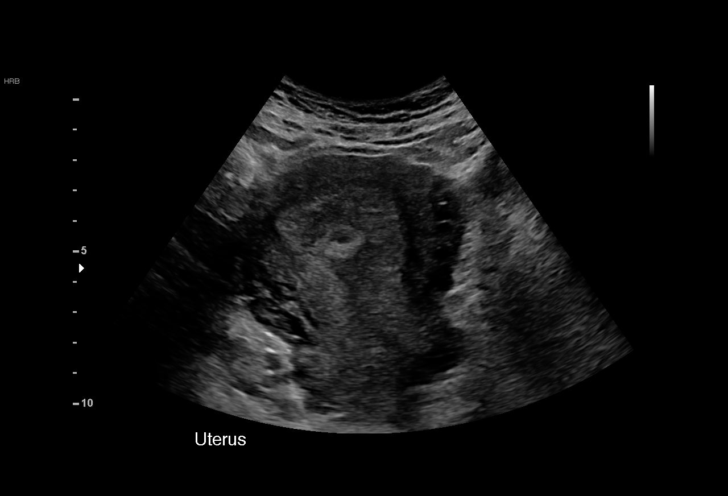
[im 17/57]
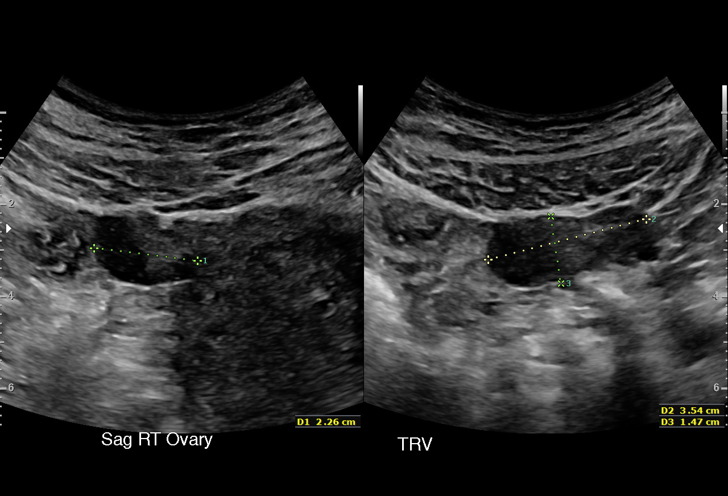
[im 21/57]
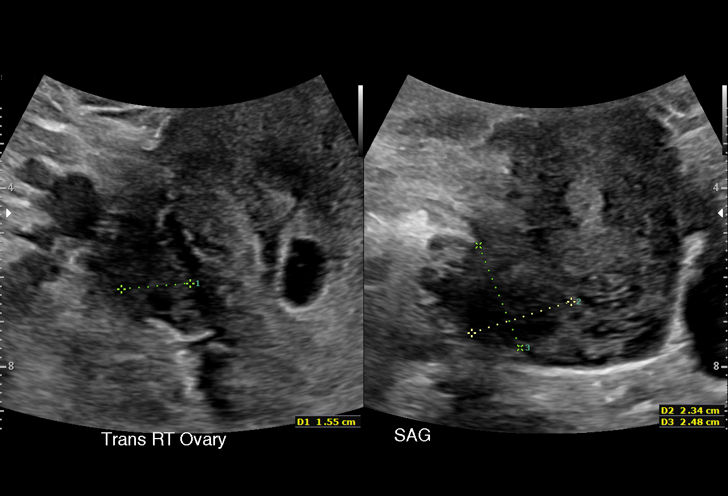
[im 25/57]
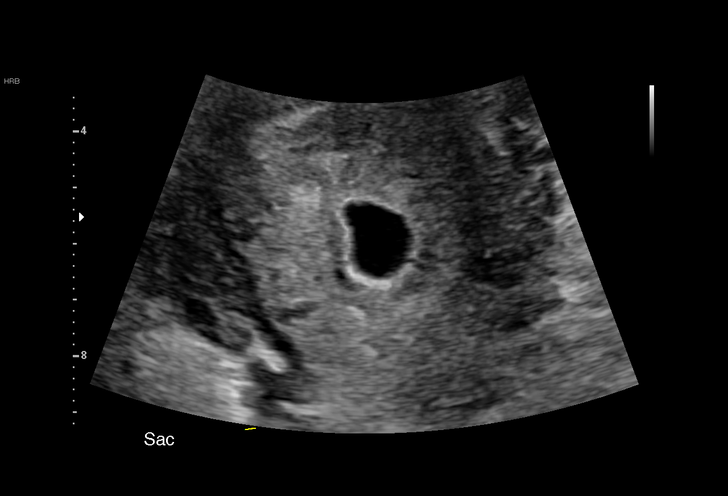
[im 30/57]
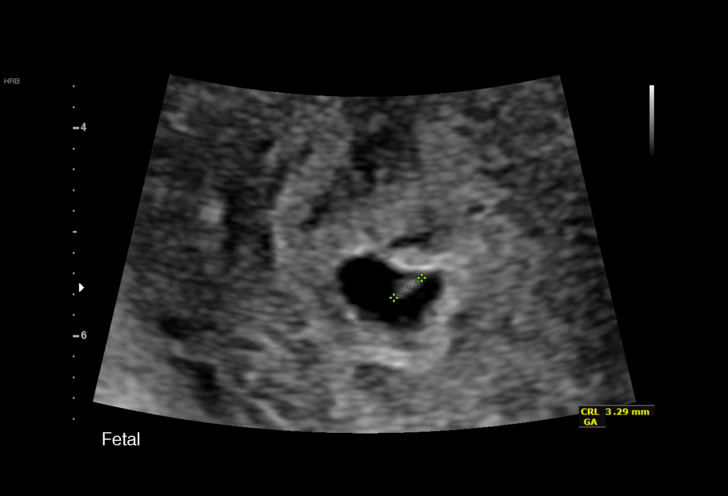
[im 32/57]
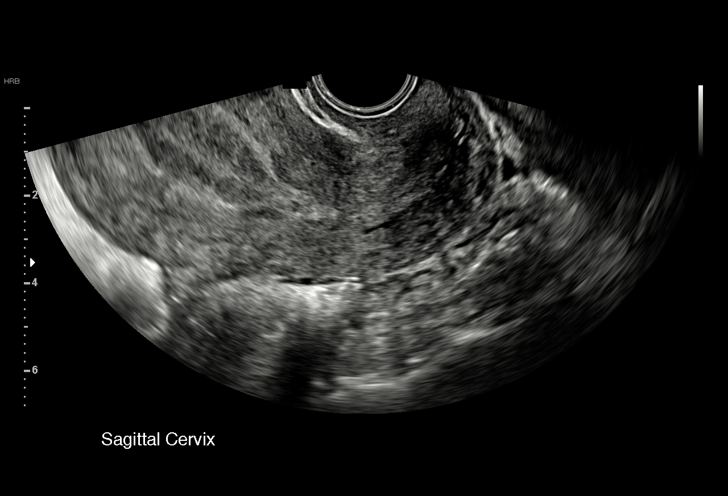
[im 36/57]
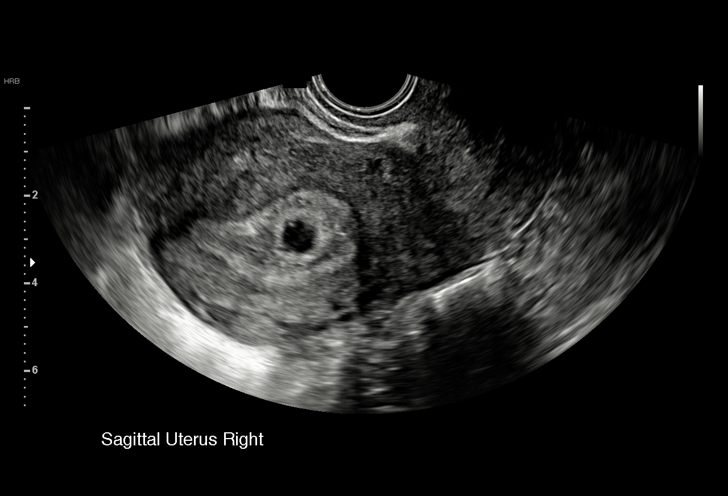
[im 40/57]
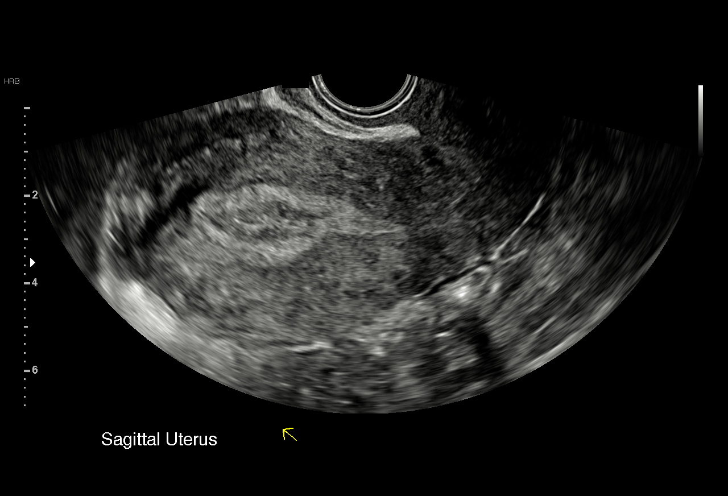
[im 44/57]
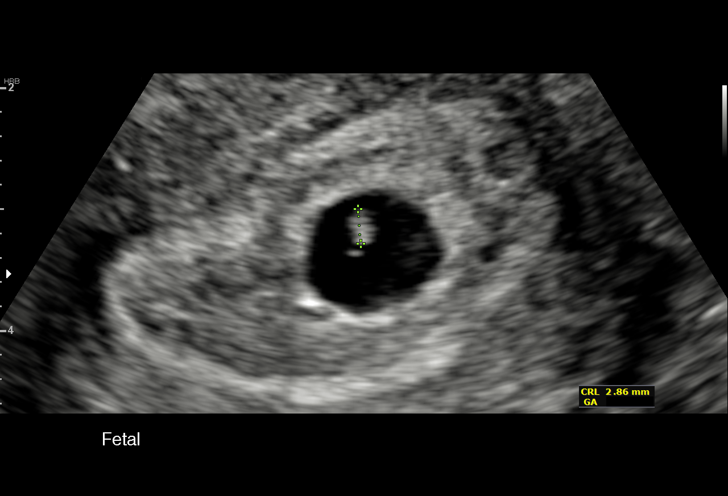
[im 48/57]
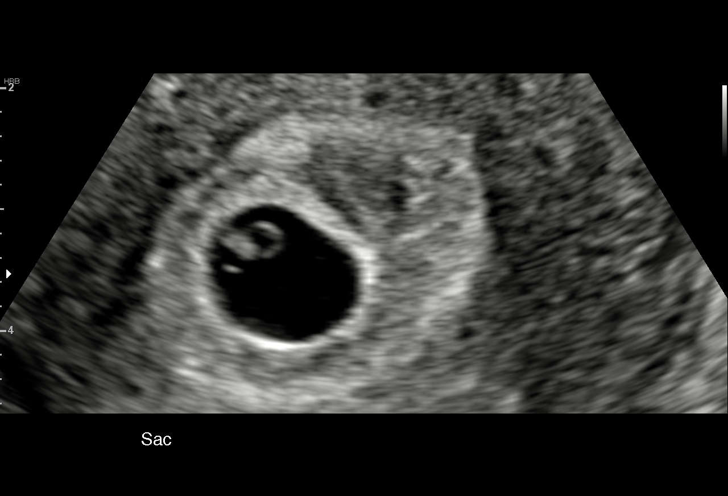
[im 52/57]
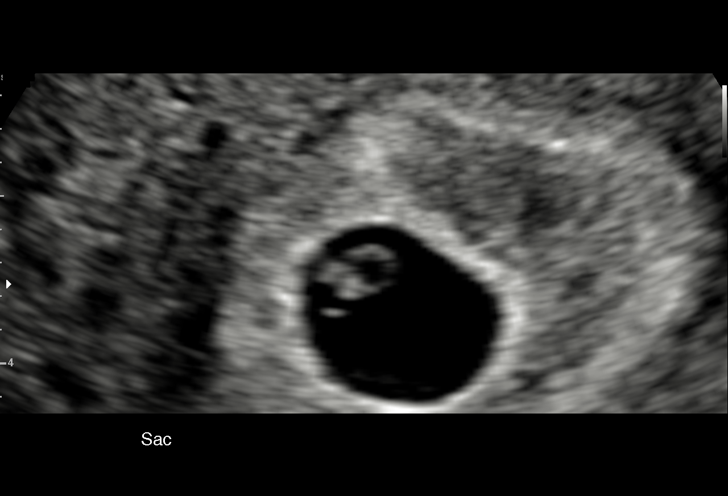
[im 57/57]
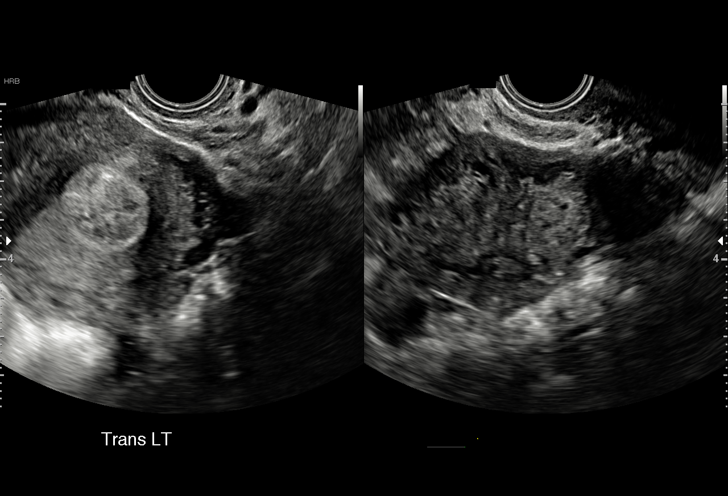

[15 of 28 positions shown; findings below may reference images not displayed]

FINDINGS: Intrauterine gestational sac: Single intrauterine gestational sac
appears normal in size, shape and position.

Yolk sac:  Visualized.

Embryo:  Visualized.

Embryonic Cardiac Activity: Not Visualized.

CRL:  2.9 mm   5 w   5 d                  US EDC: 02/23/2018

Subchorionic hemorrhage:  None visualized.

Maternal uterus/adnexae: Right ovary measures 2.1 x 1.3 x 2.2 cm.
Left ovary (transabdominal measurements) measures 3.5 x 1.5 x
cm. No abnormal ovarian or adnexal masses. No abnormal free fluid in
the pelvis. No uterine fibroids are demonstrated.
IMPRESSION: 1. Single intrauterine gestation at 5 weeks 5 days by crown-rump
length. No embryonic cardiac activity detected, which could be due
to early gestational age. Recommend follow-up obstetric scan in
11-14 days for definitive assessment of gestational viability. This
recommendation follows SRU consensus guidelines: Diagnostic Criteria
for Nonviable Pregnancy Early in the First Trimester. N Engl J Med
2. No ovarian or adnexal abnormality.

## 2019-01-07 IMAGING — US US OB TRANSVAGINAL
1 series · 15 of 28 positions shown · non-contrast
Comparison: 06/28/2017

CLINICAL DATA: Spontaneous abortion.  Bleeding, pelvic pain

EXAM:
TRANSVAGINAL OB ULTRASOUND
TECHNIQUE: Transvaginal ultrasound was performed for complete evaluation of the
gestation as well as the maternal uterus, adnexal regions, and
pelvic cul-de-sac.

[Series 1: us ob transvaginal · 31 acquisitions, 15 frames shown]
[im 1/31]
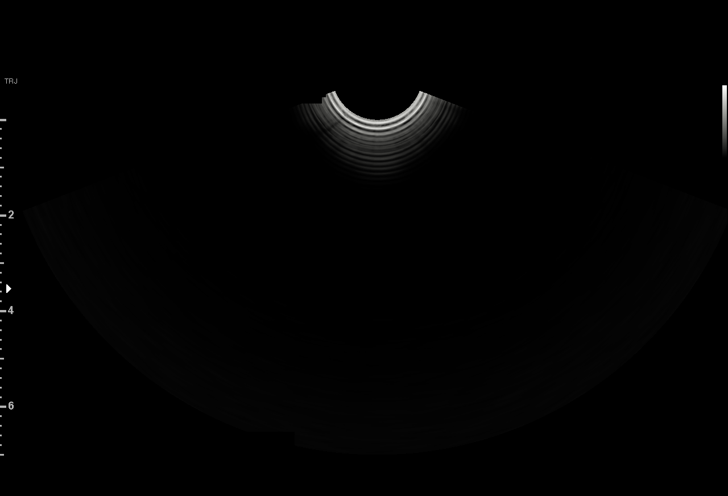
[im 3/31]
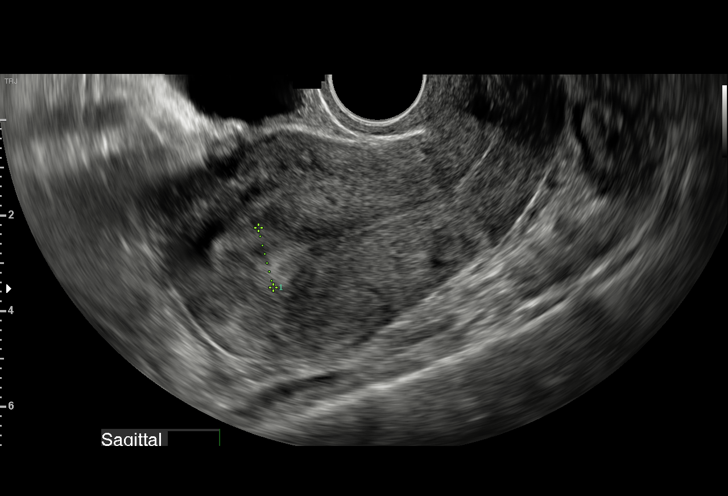
[im 5/31]
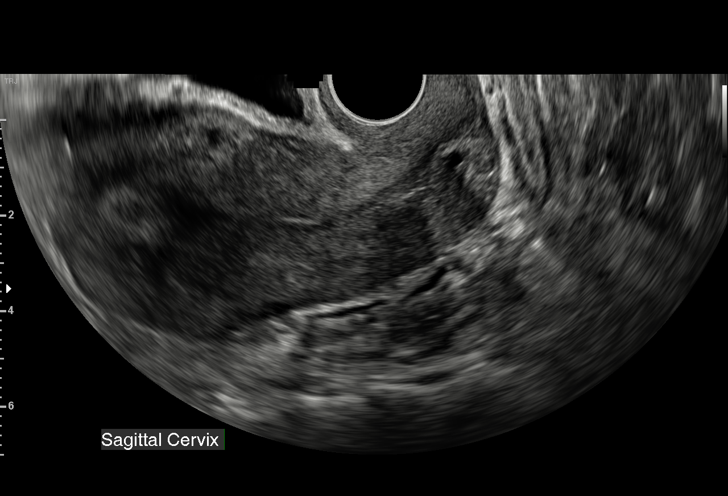
[im 7/31]
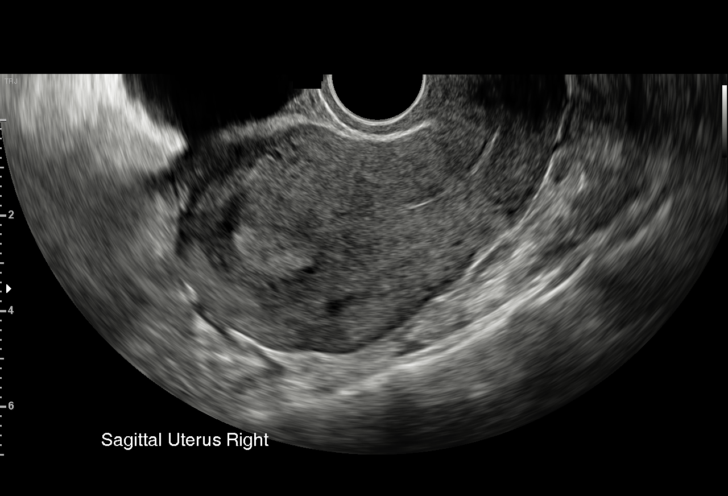
[im 9/31]
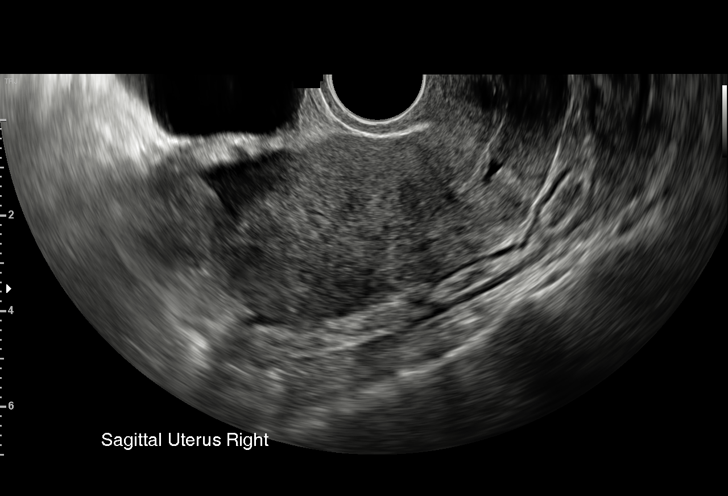
[im 12/31]
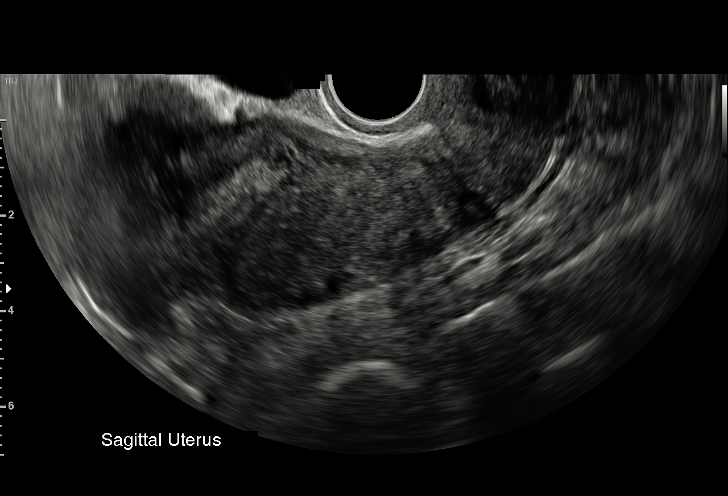
[im 14/31]
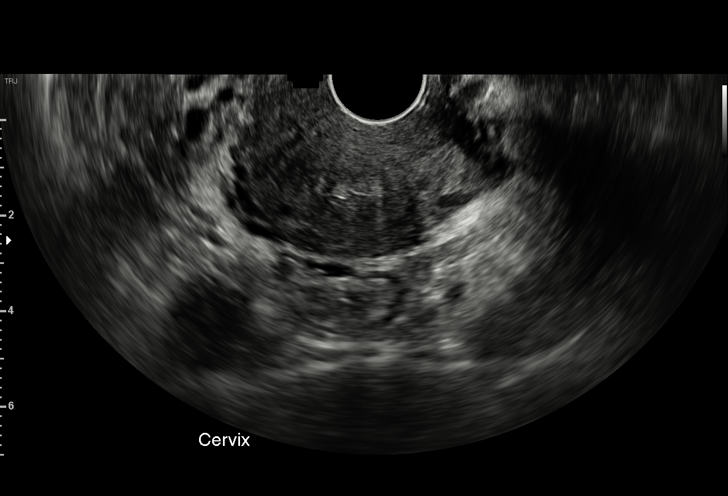
[im 16/31]
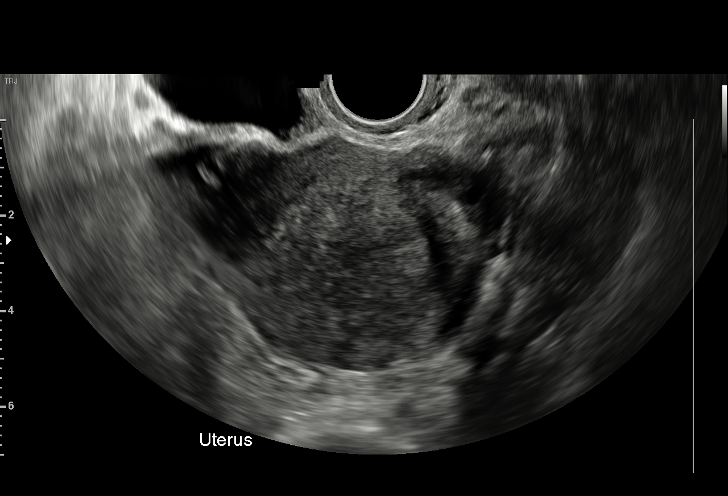
[im 17/31]
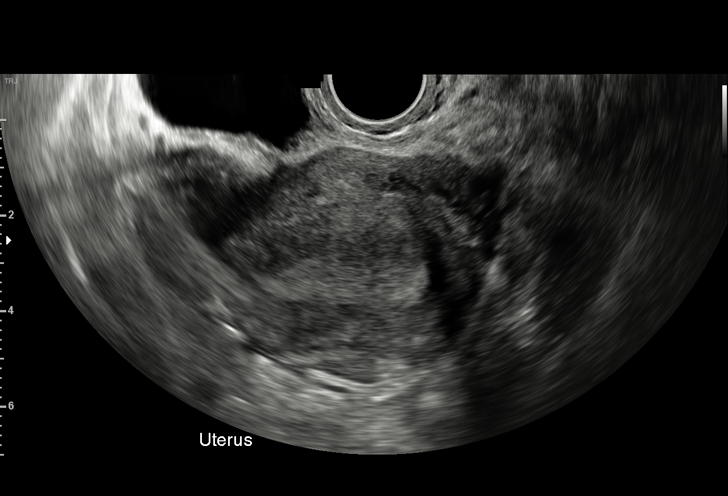
[im 19/31]
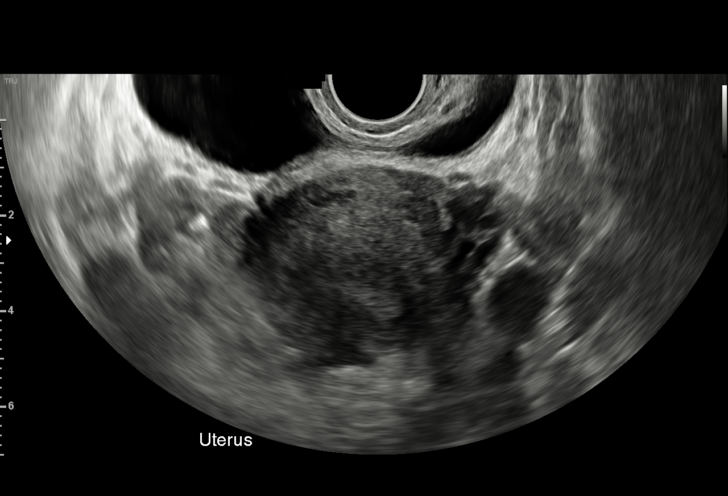
[im 22/31]
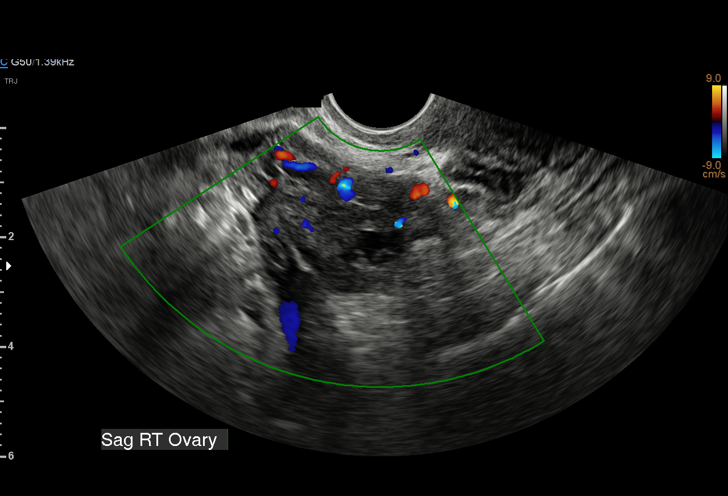
[im 24/31]
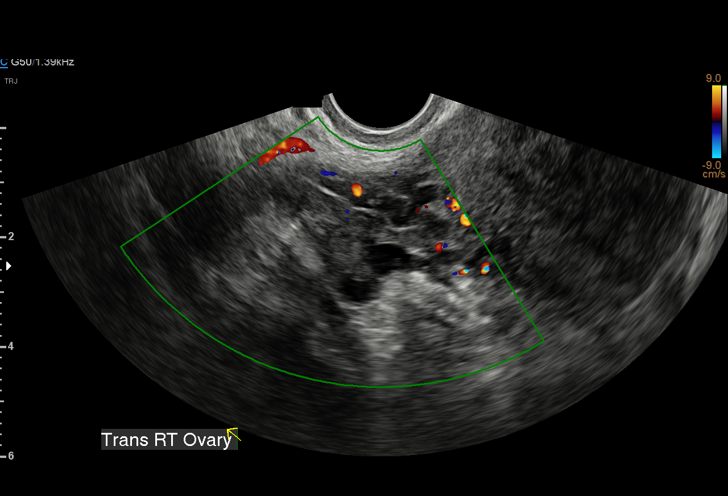
[im 26/31]
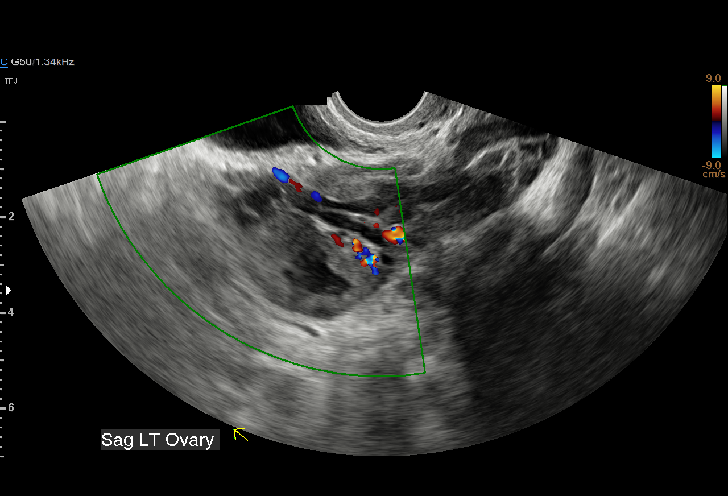
[im 28/31]
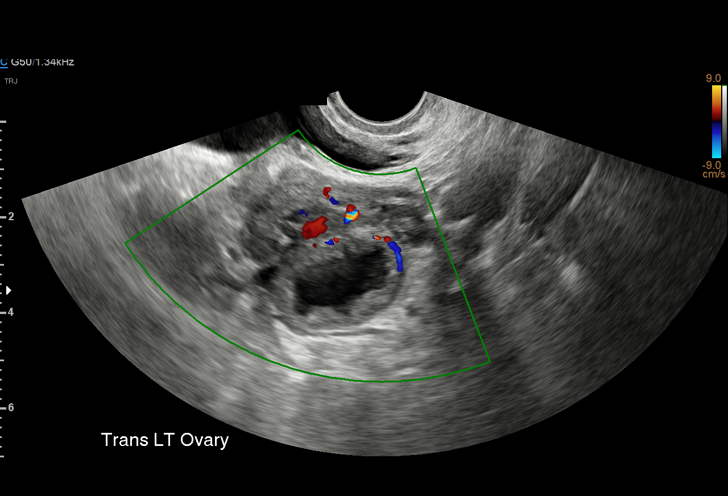
[im 31/31]
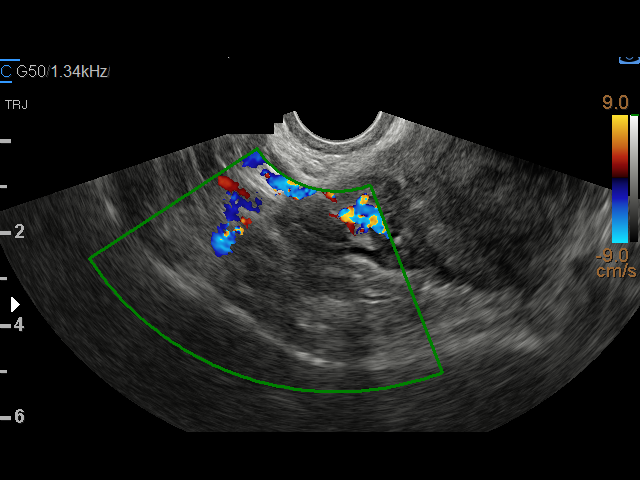

[15 of 28 positions shown; findings below may reference images not displayed]

FINDINGS: Intrauterine gestational sac: None

Yolk sac:  Not visualized

Embryo:  Not visualized

Cardiac Activity: Not visualized

Heart Rate:  bpm

MSD:   mm    w     d

CRL:     mm    w  d                  US EDC:

Subchorionic hemorrhage:  None visualized.

Maternal uterus/adnexae: Endometrium 13 mm in thickness. No visible
retained products of conception. No free fluid or adnexal mass.
Resolving corpus luteal cyst on the left.
IMPRESSION: Previously seen intrauterine gestation no longer visualized,
compatible with spontaneous abortion.
